# Patient Record
Sex: Female | Born: 1935 | Race: Black or African American | Hispanic: No | Marital: Single | Smoking: Never smoker
Health system: Southern US, Community
[De-identification: ages and names within clinical notes are randomized; demographics above are authoritative.]

## PROBLEM LIST (undated history)

## (undated) DIAGNOSIS — G8929 Other chronic pain: Secondary | ICD-10-CM

## (undated) DIAGNOSIS — M1990 Unspecified osteoarthritis, unspecified site: Secondary | ICD-10-CM

## (undated) DIAGNOSIS — D649 Anemia, unspecified: Secondary | ICD-10-CM

## (undated) DIAGNOSIS — M25569 Pain in unspecified knee: Secondary | ICD-10-CM

## (undated) DIAGNOSIS — I82409 Acute embolism and thrombosis of unspecified deep veins of unspecified lower extremity: Secondary | ICD-10-CM

## (undated) DIAGNOSIS — I10 Essential (primary) hypertension: Secondary | ICD-10-CM

## (undated) DIAGNOSIS — E46 Unspecified protein-calorie malnutrition: Secondary | ICD-10-CM

## (undated) HISTORY — PX: TOTAL KNEE ARTHROPLASTY: SHX125

## (undated) HISTORY — DX: Anemia, unspecified: D64.9

## (undated) HISTORY — DX: Acute embolism and thrombosis of unspecified deep veins of unspecified lower extremity: I82.409

---

## 2005-01-02 ENCOUNTER — Emergency Department (HOSPITAL_COMMUNITY): Admission: EM | Admit: 2005-01-02 | Discharge: 2005-01-02 | Payer: Self-pay | Admitting: Emergency Medicine

## 2010-11-25 DIAGNOSIS — I82409 Acute embolism and thrombosis of unspecified deep veins of unspecified lower extremity: Secondary | ICD-10-CM

## 2010-11-25 DIAGNOSIS — D649 Anemia, unspecified: Secondary | ICD-10-CM

## 2010-11-25 HISTORY — DX: Acute embolism and thrombosis of unspecified deep veins of unspecified lower extremity: I82.409

## 2010-11-25 HISTORY — DX: Anemia, unspecified: D64.9

## 2010-11-25 HISTORY — PX: ORIF DISTAL FEMUR FRACTURE: SUR926

## 2010-11-27 ENCOUNTER — Emergency Department (HOSPITAL_COMMUNITY): Payer: Medicare Other

## 2010-11-27 ENCOUNTER — Inpatient Hospital Stay (HOSPITAL_COMMUNITY)
Admission: EM | Admit: 2010-11-27 | Discharge: 2010-12-05 | DRG: 481 | Disposition: A | Discharge status: Skilled Nursing Facility | Payer: Medicare Other | Attending: Specialist | Admitting: Specialist

## 2010-11-27 ENCOUNTER — Other Ambulatory Visit: Payer: Self-pay

## 2010-11-27 ENCOUNTER — Emergency Department (HOSPITAL_COMMUNITY)
Admission: EM | Admit: 2010-11-27 | Discharge: 2010-11-27 | Disposition: A | Discharge status: Other Acute Inpatient Hospital | Payer: Medicare Other | Source: Home / Self Care | Attending: Emergency Medicine | Admitting: Emergency Medicine

## 2010-11-27 ENCOUNTER — Encounter: Payer: Self-pay | Admitting: Emergency Medicine

## 2010-11-27 DIAGNOSIS — W010XXA Fall on same level from slipping, tripping and stumbling without subsequent striking against object, initial encounter: Secondary | ICD-10-CM | POA: Insufficient documentation

## 2010-11-27 DIAGNOSIS — Z79899 Other long term (current) drug therapy: Secondary | ICD-10-CM

## 2010-11-27 DIAGNOSIS — Y998 Other external cause status: Secondary | ICD-10-CM

## 2010-11-27 DIAGNOSIS — Z96659 Presence of unspecified artificial knee joint: Secondary | ICD-10-CM

## 2010-11-27 DIAGNOSIS — I10 Essential (primary) hypertension: Secondary | ICD-10-CM | POA: Diagnosis present

## 2010-11-27 DIAGNOSIS — S7292XA Unspecified fracture of left femur, initial encounter for closed fracture: Secondary | ICD-10-CM

## 2010-11-27 DIAGNOSIS — S72453A Displaced supracondylar fracture without intracondylar extension of lower end of unspecified femur, initial encounter for closed fracture: Principal | ICD-10-CM | POA: Diagnosis present

## 2010-11-27 DIAGNOSIS — Y92009 Unspecified place in unspecified non-institutional (private) residence as the place of occurrence of the external cause: Secondary | ICD-10-CM

## 2010-11-27 DIAGNOSIS — M25569 Pain in unspecified knee: Secondary | ICD-10-CM | POA: Insufficient documentation

## 2010-11-27 DIAGNOSIS — D62 Acute posthemorrhagic anemia: Secondary | ICD-10-CM | POA: Diagnosis not present

## 2010-11-27 DIAGNOSIS — S7290XA Unspecified fracture of unspecified femur, initial encounter for closed fracture: Secondary | ICD-10-CM | POA: Insufficient documentation

## 2010-11-27 DIAGNOSIS — R7309 Other abnormal glucose: Secondary | ICD-10-CM | POA: Diagnosis present

## 2010-11-27 DIAGNOSIS — Z7982 Long term (current) use of aspirin: Secondary | ICD-10-CM

## 2010-11-27 DIAGNOSIS — Y93H2 Activity, gardening and landscaping: Secondary | ICD-10-CM

## 2010-11-27 HISTORY — DX: Pain in unspecified knee: M25.569

## 2010-11-27 HISTORY — DX: Essential (primary) hypertension: I10

## 2010-11-27 HISTORY — DX: Other chronic pain: G89.29

## 2010-11-27 LAB — APTT: aPTT: 28 seconds (ref 24–37)

## 2010-11-27 LAB — BASIC METABOLIC PANEL
CO2: 24 mEq/L (ref 19–32)
Calcium: 9.2 mg/dL (ref 8.4–10.5)
Chloride: 102 mEq/L (ref 96–112)
GFR calc Af Amer: 37 mL/min — ABNORMAL LOW (ref >90)
GFR calc non Af Amer: 32 mL/min — ABNORMAL LOW (ref >90)
Glucose, Bld: 111 mg/dL — ABNORMAL HIGH (ref 70–99)
Potassium: 4 mEq/L (ref 3.5–5.1)
Sodium: 138 mEq/L (ref 135–145)

## 2010-11-27 LAB — DIFFERENTIAL
Basophils Absolute: 0.1 10*3/uL (ref 0.0–0.1)
Eosinophils Absolute: 0.2 10*3/uL (ref 0.0–0.7)
Eosinophils Relative: 2 % (ref 0–5)
Lymphocytes Relative: 16 % (ref 12–46)
Lymphs Abs: 1.6 10*3/uL (ref 0.7–4.0)
Monocytes Absolute: 0.8 10*3/uL (ref 0.1–1.0)
Neutro Abs: 6.9 10*3/uL (ref 1.7–7.7)
Neutrophils Relative %: 72 % (ref 43–77)

## 2010-11-27 LAB — CBC
HCT: 36.3 % (ref 36.0–46.0)
MCH: 27.8 pg (ref 26.0–34.0)
MCHC: 31.7 g/dL (ref 30.0–36.0)
MCV: 87.9 fL (ref 78.0–100.0)
Platelets: 225 10*3/uL (ref 150–400)
RDW: 14.6 % (ref 11.5–15.5)
WBC: 9.6 10*3/uL (ref 4.0–10.5)

## 2010-11-27 LAB — PROTIME-INR
INR: 1.19 (ref 0.00–1.49)
Prothrombin Time: 15.4 seconds — ABNORMAL HIGH (ref 11.6–15.2)

## 2010-11-27 LAB — TYPE AND SCREEN: Antibody Screen: NEGATIVE

## 2010-11-27 MED ORDER — HYDROMORPHONE HCL 1 MG/ML IJ SOLN
1.0000 mg | INTRAMUSCULAR | Status: DC | PRN
  Administered 2010-11-27: 1 mg via INTRAVENOUS
  Filled 2010-11-27: qty 1

## 2010-11-27 MED ORDER — SODIUM CHLORIDE 0.9 % IV SOLN
INTRAVENOUS | Status: DC
  Administered 2010-11-27: 20:00:00 via INTRAVENOUS

## 2010-11-27 MED ORDER — HYDROMORPHONE HCL 1 MG/ML IJ SOLN
1.0000 mg | Freq: Once | INTRAMUSCULAR | Status: AC
  Administered 2010-11-27: 1 mg via INTRAVENOUS
  Filled 2010-11-27: qty 1

## 2010-11-27 NOTE — ED Provider Notes (Signed)
History     CSN: [id number] Arrival date & time: 11/27/2010  6:16 PM  Chief Complaint  Patient presents with  . Fall  . Knee Pain    (Consider location/radiation/quality/duration/timing/severity/associated sxs/prior treatment) HPI Pt reports she slipped and fell just prior to arrival, landing on her L knee. Complaining of severe aching pain, worse with movement. Unable to bear weight. She has had prior bilateral total knee arthoplasty by Dr. Hilda Lias. Denies any head injury or LOC. No other complaints at this time.  Past Medical History  Diagnosis Date  . Hypertension   . Chronic knee pain     Past Surgical History  Procedure Date  . Knee surgery     History reviewed. No pertinent family history.  History  Substance Use Topics  . Smoking status: Never Smoker   . Smokeless tobacco: Not on file  . Alcohol Use: No    OB History    Grav Para Term Preterm Abortions TAB SAB Ect Mult Living                  Review of Systems All other systems reviewed and are negative except as noted in HPI.    Allergies  Review of patient's allergies indicates no known allergies.  Home Medications   Current Outpatient Rx  Name Route Sig Dispense Refill  . ASPIRIN EC 81 MG PO TBEC Oral Take 81 mg by mouth every other day.      Marland Kitchen PRESCRIPTION MEDICATION Oral Take 1 tablet by mouth daily. UNKNOWN NAME **Blood Pressure**     . TRAMADOL HCL 50 MG PO TABS Oral Take 50 mg by mouth 2 (two) times daily. Prn for pain       BP 136/69  Pulse 88  Temp(Src) 98 F (36.7 C) (Oral)  Resp 18  SpO2 98%  Physical Exam  Nursing note and vitals reviewed. Constitutional: She is oriented to person, place, and time. She appears well-developed and well-nourished.  HENT:  Head: Normocephalic and atraumatic.  Eyes: EOM are normal. Pupils are equal, round, and reactive to light.  Neck: Normal range of motion. Neck supple.  Cardiovascular: Normal rate, normal heart sounds and intact distal pulses.     Pulmonary/Chest: Effort normal and breath sounds normal.  Abdominal: Bowel sounds are normal. She exhibits no distension. There is no tenderness.  Musculoskeletal: Normal range of motion. She exhibits tenderness (tender, swollen and deformity of L knee/distal femur unable to ROM due to pain). She exhibits no edema.       Neuro vascularly intact  Neurological: She is alert and oriented to person, place, and time. She has normal strength. No cranial nerve deficit or sensory deficit.  Skin: Skin is warm and dry. No rash noted.  Psychiatric: She has a normal mood and affect.    ED Course  Procedures (including critical care time)  Labs Reviewed  CBC - Abnormal; Notable for the following:    Hemoglobin 11.5 (*)    All other components within normal limits  BASIC METABOLIC PANEL - Abnormal; Notable for the following:    Glucose, Bld 111 (*)    Creatinine, Ser 1.46 (*)    GFR calc non Af Amer 32 (*)    GFR calc Af Amer 37 (*)    All other components within normal limits  PROTIME-INR - Abnormal; Notable for the following:    Prothrombin Time 15.4 (*)    All other components within normal limits  DIFFERENTIAL  APTT  TYPE AND SCREEN  Dg Chest 1 View  11/27/2010  *RADIOLOGY REPORT*  Clinical Data: Fall.  No chest complaints.  CHEST - 1 VIEW  Comparison: None.  Findings: No obvious fracture or pneumothorax.  Calcified mildly tortuous aorta.  Cardiomegaly.  Prominence of the right hilar region may be related to prominent vessels.  This can be assessed on follow-up two-view exam to help exclude underlying mass.  Minimal atelectatic changes right lung base.  No infiltrate or congestive heart failure.  IMPRESSION: No obvious fracture or pneumothorax.  Calcified mildly tortuous aorta.  Cardiomegaly.  Prominence of the right hilar region may be related to prominent vessels.  This can be assessed on follow-up two-view exam to help exclude underlying mass.  Minimal atelectatic changes right lung base.   No infiltrate or congestive heart failure.  Original Report Authenticated By: Fuller Canada, M.D.   Dg Knee Complete 4 Views Left  11/27/2010  *RADIOLOGY REPORT*  Clinical Data: Fall, left knee pain  LEFT KNEE - COMPLETE 4+ VIEW  Comparison: None.  Findings: Four views of the left knee submitted.  There is oblique displaced fracture of the distal left femur just above the prosthesis.  Left knee prosthesis in anatomic alignment. Diffuse osteopenia is noted.  IMPRESSION: Oblique displaced fracture of distal left femur.  Per CMS PQRS reporting requirements (PQRS Measure 24): Given the patient's age of greater than 50 and the fracture site (hip, distal radius, or spine), the patient should be tested for osteoporosis using DXA, and the appropriate treatment considered based on the DXA results.  Original Report Authenticated By: Natasha Mead, M.D.      MDM  Spoke with Dr. Romeo Apple on call for Ortho who recommended transfer to [facility] due to his inability to repair this fracture. Spoke with Dr. Lestine Box at [facility] who also was not comfortable with the repair and referred me to the Orthopedist at [facility]. Dr. Otelia Sergeant at [facility] has graciously agreed to accept the patient in transfer to the ED for evaluation and repair of her fracture. Spoke with Dr. Juleen China in the ED at [facility] to inform him of the patients pending arrival. [facility] to transfer. Pt amenable to this plan. Knee immobilizer ordered.         Charles B. Bernette Mayers, MD 11/27/10 2152

## 2010-11-27 NOTE — ED Notes (Signed)
Beeped on call ortho at [facility] through [facility].

## 2010-11-27 NOTE — ED Notes (Signed)
Pt hanging plants on porch and fell on L knee. Pt has hx of bilateral knee surgeries and chronic pain. Did not hit head or LOC. Pt is alert/roiented. nad at this time.

## 2010-11-27 NOTE — ED Notes (Signed)
Report called to [facility] ED.  Pt transported via [facility].

## 2010-11-28 ENCOUNTER — Inpatient Hospital Stay (HOSPITAL_COMMUNITY): Payer: Medicare Other

## 2010-11-28 LAB — BASIC METABOLIC PANEL
BUN: 22 mg/dL (ref 6–23)
CO2: 24 mEq/L (ref 19–32)
Calcium: 8.9 mg/dL (ref 8.4–10.5)
Chloride: 106 mEq/L (ref 96–112)
Creatinine, Ser: 1.35 mg/dL — ABNORMAL HIGH (ref 0.50–1.10)
GFR calc Af Amer: 41 mL/min — ABNORMAL LOW (ref >90)
GFR calc non Af Amer: 35 mL/min — ABNORMAL LOW (ref >90)
Glucose, Bld: 136 mg/dL — ABNORMAL HIGH (ref 70–99)
Potassium: 4.1 mEq/L (ref 3.5–5.1)
Sodium: 140 mEq/L (ref 135–145)

## 2010-11-28 LAB — CBC
HCT: 30 % — ABNORMAL LOW (ref 36.0–46.0)
Hemoglobin: 9.5 g/dL — ABNORMAL LOW (ref 12.0–15.0)
MCH: 27.9 pg (ref 26.0–34.0)
MCHC: 31.7 g/dL (ref 30.0–36.0)
RDW: 14.7 % (ref 11.5–15.5)
WBC: 8.5 10*3/uL (ref 4.0–10.5)

## 2010-11-28 LAB — ABO/RH: ABO/RH(D): O POS

## 2010-11-29 LAB — BASIC METABOLIC PANEL
BUN: 19 mg/dL (ref 6–23)
CO2: 23 mEq/L (ref 19–32)
Chloride: 108 mEq/L (ref 96–112)
GFR calc non Af Amer: 40 mL/min — ABNORMAL LOW (ref >90)
Glucose, Bld: 106 mg/dL — ABNORMAL HIGH (ref 70–99)
Potassium: 4.2 mEq/L (ref 3.5–5.1)

## 2010-11-29 LAB — CBC
Hemoglobin: 8.4 g/dL — ABNORMAL LOW (ref 12.0–15.0)
MCHC: 32.2 g/dL (ref 30.0–36.0)
MCV: 86.7 fL (ref 78.0–100.0)
Platelets: 166 10*3/uL (ref 150–400)
RBC: 3.01 MIL/uL — ABNORMAL LOW (ref 3.87–5.11)
RDW: 15 % (ref 11.5–15.5)
WBC: 11.4 10*3/uL — ABNORMAL HIGH (ref 4.0–10.5)

## 2010-11-29 LAB — PROTIME-INR
INR: 1.36 (ref 0.00–1.49)
Prothrombin Time: 17 seconds — ABNORMAL HIGH (ref 11.6–15.2)

## 2010-11-30 ENCOUNTER — Inpatient Hospital Stay (HOSPITAL_COMMUNITY): Payer: Medicare Other

## 2010-11-30 LAB — CBC
HCT: 22.3 % — ABNORMAL LOW (ref 36.0–46.0)
Hemoglobin: 7.2 g/dL — ABNORMAL LOW (ref 12.0–15.0)
MCV: 88.1 fL (ref 78.0–100.0)
RBC: 2.53 MIL/uL — ABNORMAL LOW (ref 3.87–5.11)
WBC: 9.5 10*3/uL (ref 4.0–10.5)

## 2010-11-30 LAB — BASIC METABOLIC PANEL
BUN: 14 mg/dL (ref 6–23)
CO2: 26 mEq/L (ref 19–32)
Chloride: 109 mEq/L (ref 96–112)
Creatinine, Ser: 1.04 mg/dL (ref 0.50–1.10)
Potassium: 3.9 mEq/L (ref 3.5–5.1)

## 2010-11-30 LAB — PROTIME-INR: INR: 1.51 — ABNORMAL HIGH (ref 0.00–1.49)

## 2010-12-01 LAB — CBC
Hemoglobin: 8.1 g/dL — ABNORMAL LOW (ref 12.0–15.0)
MCH: 28.2 pg (ref 26.0–34.0)
MCV: 85.7 fL (ref 78.0–100.0)
Platelets: 187 10*3/uL (ref 150–400)
RBC: 2.87 MIL/uL — ABNORMAL LOW (ref 3.87–5.11)
WBC: 10.3 10*3/uL (ref 4.0–10.5)

## 2010-12-01 LAB — PROTIME-INR: Prothrombin Time: 17.1 seconds — ABNORMAL HIGH (ref 11.6–15.2)

## 2010-12-02 LAB — PROTIME-INR
INR: 1.38 (ref 0.00–1.49)
Prothrombin Time: 17.2 seconds — ABNORMAL HIGH (ref 11.6–15.2)

## 2010-12-03 LAB — TYPE AND SCREEN
ABO/RH(D): O POS
Unit division: 0
Unit division: 0

## 2010-12-03 LAB — PROTIME-INR
INR: 1.31 (ref 0.00–1.49)
Prothrombin Time: 16.5 seconds — ABNORMAL HIGH (ref 11.6–15.2)

## 2010-12-03 LAB — HEMOGLOBIN AND HEMATOCRIT, BLOOD: Hemoglobin: 7.5 g/dL — ABNORMAL LOW (ref 12.0–15.0)

## 2010-12-04 LAB — HEMOGLOBIN AND HEMATOCRIT, BLOOD
HCT: 23.3 % — ABNORMAL LOW (ref 36.0–46.0)
Hemoglobin: 7.4 g/dL — ABNORMAL LOW (ref 12.0–15.0)

## 2010-12-05 ENCOUNTER — Inpatient Hospital Stay
Admission: RE | Admit: 2010-12-05 | Discharge: 2011-02-08 | Disposition: A | Discharge status: Home / Self Care | Payer: Medicaid Other | Source: Ambulatory Visit | Attending: Internal Medicine | Admitting: Internal Medicine

## 2010-12-05 DIAGNOSIS — R609 Edema, unspecified: Principal | ICD-10-CM

## 2010-12-05 DIAGNOSIS — R52 Pain, unspecified: Secondary | ICD-10-CM

## 2010-12-05 DIAGNOSIS — Z139 Encounter for screening, unspecified: Secondary | ICD-10-CM

## 2010-12-05 LAB — CROSSMATCH: Antibody Screen: NEGATIVE

## 2010-12-05 LAB — HEMOGLOBIN AND HEMATOCRIT, BLOOD: Hemoglobin: 10.6 g/dL — ABNORMAL LOW (ref 12.0–15.0)

## 2010-12-06 NOTE — H&P (Unsigned)
NAME:  JALEY, YAN NO.:  [id number]  MEDICAL RECORD NO.:  [id number]  LOCATION:  S145                          FACILITY:  [facility]  PHYSICIAN:  Savonna Birchmeier D. Felecia Shelling, MD   DATE OF BIRTH:  Sep 17, 1925  DATE OF ADMISSION:  12/05/2010 DATE OF DISCHARGE:  LH                             HISTORY & PHYSICAL   REASON FOR ADMISSION:  To nursing home for physical therapy following a fracture of the left distal supracondylar femur.  HISTORY OF PRESENT ILLNESS:  This is an 77 year old female patient with history of hypertension, anemia and status post bilateral knee replacement who had an accidental fall on her porch and she was transferred to [facility].  The patient underwent a closely reduction and internal fixation over the left periprosthetic supracondylar femur fracture.  Postoperatively, the patient became anemic.  Her CBC was monitored and she was transfused 2 units of packed red blood cells.  The patient was transferred here for physical therapy and skilled nursing care.  PAST MEDICAL HISTORY: 1. Hypertension. 2. Anemia. 3. Status post bilateral knee replacement. 4. Closed left distal supracondylar femur fracture. 5. Status post closed reduction and internal fixation of the left     periprosthetic supracondylar fracture.  CURRENT MEDICATIONS: 1. Colace 100 mg b.i.d. 2. Ferrous sulfate 325 mg b.i.d. 3. Robaxin 1 tablet p.o. q.6 hours. 4. Percocet 5/325 one tablet p.o. q.6 hours p.r.n. 5. MiraLax 17 g daily. 6. Ultram 50 mg q.6 hours. 7. Azor 5/40 one tablet daily. 8. The patient would be on Coumadin and would be anticoagulated until     the patient is able to ambulate.  SOCIAL HISTORY:  The patient lives with her son.  No history of alcohol, tobacco, or substance abuse.  PHYSICAL EXAMINATION:  GENERAL:  The patient is alert, awake and chronically sick looking. VITALS:  Blood pressure 130/80, pulse 88, respiratory rate 16, temperature 98 degrees  Fahrenheit. HEENT:  Pupils are equal, reactive. NECK:  Supple. CHEST:  Clear lung fields.  Good air entry. CARDIOVASCULAR SYSTEM:  First and second heart sound heard.  No murmur. No gallop. ABDOMEN:  Soft and lax.  Bowel sound is positive.  No mass or organomegaly. EXTREMITIES:  The patient has splinted and immobilized in her left knee. There is no leg swelling.  ASSESSMENT: 1. Left distal femoral fracture. 2. Status post closed distal supracondylar femur fracture reduction     and internal fixation. 3. Hypertension. 4. Postoperative anemia. 5. History of bilateral knee replacement.  PLAN:  We will continue the patient on physical therapy and occupational therapy.  Continue anticoagulation.  We will continue to monitor her PT/INR.  Continue assisting daily living activity.  Continue current medications including pain management.  We will plan to discharge the patient home as soon as she is able to ambulate and participate in her daily living activity.     Lilyanah Celestin D. Felecia Shelling, MD     TDF/MEDQ  D:  12/06/2010  T:  12/06/2010  Job:  [id number]

## 2010-12-26 DIAGNOSIS — Z7901 Long term (current) use of anticoagulants: Secondary | ICD-10-CM | POA: Insufficient documentation

## 2011-01-03 ENCOUNTER — Ambulatory Visit (HOSPITAL_COMMUNITY)
Admit: 2011-01-03 | Discharge: 2011-01-03 | Disposition: A | Discharge status: Skilled Nursing Facility | Payer: Medicare Other | Source: Skilled Nursing Facility | Attending: Internal Medicine | Admitting: Internal Medicine

## 2011-01-03 DIAGNOSIS — M7989 Other specified soft tissue disorders: Secondary | ICD-10-CM | POA: Insufficient documentation

## 2011-01-03 DIAGNOSIS — I824Y9 Acute embolism and thrombosis of unspecified deep veins of unspecified proximal lower extremity: Secondary | ICD-10-CM | POA: Insufficient documentation

## 2011-01-03 DIAGNOSIS — M79609 Pain in unspecified limb: Secondary | ICD-10-CM | POA: Insufficient documentation

## 2011-01-07 ENCOUNTER — Other Ambulatory Visit (HOSPITAL_COMMUNITY): Payer: Medicare Other

## 2011-01-21 ENCOUNTER — Inpatient Hospital Stay (HOSPITAL_COMMUNITY)
Admit: 2011-01-21 | Discharge: 2011-01-21 | Disposition: A | Discharge status: Home / Self Care | Payer: Medicare Other | Attending: Internal Medicine | Admitting: Internal Medicine

## 2011-03-20 ENCOUNTER — Ambulatory Visit (INDEPENDENT_AMBULATORY_CARE_PROVIDER_SITE_OTHER): Payer: Medicare Other | Admitting: *Deleted

## 2011-03-20 ENCOUNTER — Encounter: Payer: Self-pay | Admitting: Cardiology

## 2011-03-20 ENCOUNTER — Ambulatory Visit (INDEPENDENT_AMBULATORY_CARE_PROVIDER_SITE_OTHER): Payer: Medicare Other | Admitting: Cardiology

## 2011-03-20 VITALS — BP 143/77 | HR 103 | Resp 16 | Ht 65.0 in | Wt 175.0 lb

## 2011-03-20 DIAGNOSIS — S72453A Displaced supracondylar fracture without intracondylar extension of lower end of unspecified femur, initial encounter for closed fracture: Secondary | ICD-10-CM | POA: Insufficient documentation

## 2011-03-20 DIAGNOSIS — Z7901 Long term (current) use of anticoagulants: Secondary | ICD-10-CM

## 2011-03-20 DIAGNOSIS — D649 Anemia, unspecified: Secondary | ICD-10-CM | POA: Insufficient documentation

## 2011-03-20 DIAGNOSIS — I749 Embolism and thrombosis of unspecified artery: Secondary | ICD-10-CM

## 2011-03-20 DIAGNOSIS — I82409 Acute embolism and thrombosis of unspecified deep veins of unspecified lower extremity: Secondary | ICD-10-CM | POA: Insufficient documentation

## 2011-03-20 LAB — POCT INR: INR: 1.8

## 2011-03-20 NOTE — Assessment & Plan Note (Signed)
Patient already has compression stockings and is advised to wear them although no post phlebitic abnormalities are noted at present.

## 2011-03-20 NOTE — Patient Instructions (Signed)
Your physician recommends that you schedule a follow-up appointment in: 3 months  Your physician recommends that you return for lab work in: Within the week  Wear Compression Stockings  Stools x 3 for blood and return to office asap

## 2011-03-20 NOTE — Assessment & Plan Note (Signed)
Patient is tolerating anticoagulation well without apparent bleeding or other complications.  Total course of 6 months appears reasonable for deep vein thrombosis in the setting of a fracture of the femur, operative intervention and limited postoperative mobility.

## 2011-03-20 NOTE — Assessment & Plan Note (Signed)
CBC will be reassessed and occult GI blood loss excluded with stool Hemoccult testing.

## 2011-03-20 NOTE — Progress Notes (Signed)
Patient ID: Leslie Pope, female   DOB: 02-Jun-1925, 78 y.o.   MRN: [id number] HPI: Initial office visit arranged at the kind request of Dr. Felecia Shelling for continuing management of anticoagulation.  This nice woman suffered a fall in October resulting in a femoral fracture treated with closed reduction and application of a knee brace.  She was diagnosed with deep vein thrombosis in the left leg the following month and has been maintained on warfarin since without swelling or significant discomfort.  She was recently discharged from a local skilled nursing facility and now requires anticoagulation management.  Current Outpatient Prescriptions on File Prior to Visit  Medication Sig Dispense Refill  . traMADol (ULTRAM) 50 MG tablet Take 50 mg by mouth 2 (two) times daily. Prn for pain        No Known Allergies    Past Medical History  Diagnosis Date  . Hypertension   . Chronic knee pain      Past Surgical History  Procedure Date  . Knee surgery      History reviewed. No pertinent family history.   History   Social History  . Marital Status: Single    Spouse Name: N/A    Number of Children: N/A  . Years of Education: N/A   Occupational History  . Not on file.   Social History Main Topics  . Smoking status: Never Smoker   . Smokeless tobacco: Not on file  . Alcohol Use: No  . Drug Use: No  . Sexually Active:    Other Topics Concern  . Not on file   Social History Narrative  . No narrative on file  ROS:      All other systems reviewed and are negative.  PHYSICAL EXAM: BP 143/77  Pulse 103  Resp 16  Ht 5\' 5"  (1.651 m)  Wt 79.379 kg (175 lb)  BMI 29.12 kg/m2; 11 pound weight loss in 6 weeks  General-Well-developed; no acute distress Body Habitus-mildly overweight HEENT-[place]/AT; PERRL; EOM intact; conjunctiva and lids nl; bilateral arcus Neck-No JVD; no carotid bruits Endocrine-No thyromegaly Lungs-Clear lung fields; resonant percussion; normal I-to-E ratio Cardiovascular-  normal PMI; normal S1 and S2 Abdomen-BS normal; soft and non-tender without masses or organomegaly Musculoskeletal-No deformities, cyanosis or clubbing Neurologic-Nl cranial nerves; symmetric strength and tone Skin- Warm, no significant lesions Extremities-Nl distal pulses; trace edema   EKG:  Tracing obtained 11/27/10 was reviewed.  Normal sinus rhythm, borderline left atrial abnormality, delayed R-wave progression-likely related to pre-excitation(WPW type B).  No previous tracing for comparison.  ASSESSMENT AND PLAN:  Sutton Bing, MD 03/20/2011 1:43 PM

## 2011-03-26 ENCOUNTER — Encounter: Payer: Self-pay | Admitting: *Deleted

## 2011-03-26 LAB — COMPREHENSIVE METABOLIC PANEL
Albumin: 3.9 g/dL (ref 3.5–5.2)
BUN: 13 mg/dL (ref 6–23)
CO2: 22 mEq/L (ref 19–32)
Calcium: 9.1 mg/dL (ref 8.4–10.5)
Chloride: 104 mEq/L (ref 96–112)
Creat: 1.16 mg/dL — ABNORMAL HIGH (ref 0.50–1.10)
Glucose, Bld: 94 mg/dL (ref 70–99)
Potassium: 4.5 mEq/L (ref 3.5–5.3)

## 2011-03-26 LAB — CBC
MCHC: 31.9 g/dL (ref 30.0–36.0)
Platelets: 264 10*3/uL (ref 150–400)
RDW: 14 % (ref 11.5–15.5)

## 2011-04-01 ENCOUNTER — Other Ambulatory Visit: Payer: Self-pay

## 2011-04-01 ENCOUNTER — Encounter (INDEPENDENT_AMBULATORY_CARE_PROVIDER_SITE_OTHER): Payer: Medicare Other | Admitting: *Deleted

## 2011-04-01 DIAGNOSIS — Z7901 Long term (current) use of anticoagulants: Secondary | ICD-10-CM

## 2011-04-03 ENCOUNTER — Ambulatory Visit (INDEPENDENT_AMBULATORY_CARE_PROVIDER_SITE_OTHER): Payer: Medicare Other | Admitting: *Deleted

## 2011-04-03 DIAGNOSIS — Z7901 Long term (current) use of anticoagulants: Secondary | ICD-10-CM

## 2011-04-03 DIAGNOSIS — I749 Embolism and thrombosis of unspecified artery: Secondary | ICD-10-CM

## 2011-04-03 LAB — POCT INR: INR: 2.1

## 2011-04-24 ENCOUNTER — Ambulatory Visit (INDEPENDENT_AMBULATORY_CARE_PROVIDER_SITE_OTHER): Payer: 59 | Admitting: *Deleted

## 2011-04-24 DIAGNOSIS — I749 Embolism and thrombosis of unspecified artery: Secondary | ICD-10-CM

## 2011-04-24 DIAGNOSIS — Z7901 Long term (current) use of anticoagulants: Secondary | ICD-10-CM

## 2011-05-22 ENCOUNTER — Ambulatory Visit (INDEPENDENT_AMBULATORY_CARE_PROVIDER_SITE_OTHER): Payer: 59 | Admitting: *Deleted

## 2011-05-22 DIAGNOSIS — I749 Embolism and thrombosis of unspecified artery: Secondary | ICD-10-CM

## 2011-05-22 DIAGNOSIS — Z7901 Long term (current) use of anticoagulants: Secondary | ICD-10-CM

## 2011-06-12 ENCOUNTER — Ambulatory Visit (INDEPENDENT_AMBULATORY_CARE_PROVIDER_SITE_OTHER): Payer: 59 | Admitting: *Deleted

## 2011-06-12 DIAGNOSIS — I749 Embolism and thrombosis of unspecified artery: Secondary | ICD-10-CM

## 2011-06-12 DIAGNOSIS — Z7901 Long term (current) use of anticoagulants: Secondary | ICD-10-CM

## 2011-06-17 ENCOUNTER — Telehealth: Payer: Self-pay | Admitting: Cardiology

## 2011-06-17 MED ORDER — WARFARIN SODIUM 5 MG PO TABS: 5.0000 mg | ORAL_TABLET | ORAL | Status: DC

## 2011-06-17 NOTE — Telephone Encounter (Signed)
rx sent into pharmacy, pt notified. 

## 2011-06-17 NOTE — Telephone Encounter (Signed)
Pt needs warfarin called in to [facility]

## 2011-06-18 ENCOUNTER — Ambulatory Visit: Payer: Medicare Other | Admitting: Cardiology

## 2011-06-19 ENCOUNTER — Ambulatory Visit (INDEPENDENT_AMBULATORY_CARE_PROVIDER_SITE_OTHER): Payer: 59 | Admitting: Adult Health

## 2011-06-19 ENCOUNTER — Encounter: Payer: Self-pay | Admitting: Adult Health

## 2011-06-19 VITALS — BP 165/71 | HR 82 | Resp 16 | Ht 66.0 in | Wt 178.0 lb

## 2011-06-19 DIAGNOSIS — Z7901 Long term (current) use of anticoagulants: Secondary | ICD-10-CM

## 2011-06-19 DIAGNOSIS — I82409 Acute embolism and thrombosis of unspecified deep veins of unspecified lower extremity: Secondary | ICD-10-CM

## 2011-06-19 NOTE — Assessment & Plan Note (Signed)
She is stable from a cardiac standpoint. No bleeding issues or tarry stools. She is followed in our coumadin clinic for dosing. She is frustrated that she cannot get around as she used to and be as active. She is trying to get used to the walker and need to depend on others for help. She is modifying her activities to accomodate for her limitations.

## 2011-06-19 NOTE — Progress Notes (Signed)
   HPI: Leslie Pope is a pleasant 77 y/o patient of Dr. Dietrich Pates we are seeing on follow-up for hypertension, DVT s/p femoral fracture with closed reduction,with chronic knee pain, on coumadin therapy. She has been doing well but continues to have some pain behind her knee when she is in bed at night. She has been a little depressed because she is not able to do as much as she used due to the left leg injury and chronic arthritis.Seh enjoys working in her garden and now has to depend on a walker for stability. She has no complaints of bleeding, chest pain, dizziness or DOE.   No Known Allergies  Current Outpatient Prescriptions  Medication Sig Dispense Refill  . alendronate (FOSAMAX) 70 MG tablet Take 70 mg by mouth every 7 (seven) days. Take with a full glass of water on an empty stomach.      Marland Kitchen amLODipine-olmesartan (AZOR) 5-40 MG per tablet Take 1 tablet by mouth daily.      . baclofen (LIORESAL) 10 MG tablet Take 10 mg by mouth 2 (two) times daily.      . traMADol (ULTRAM) 50 MG tablet Take 50 mg by mouth 2 (two) times daily. Prn for pain       . triamcinolone (KENALOG) 0.025 % cream Apply topically 2 (two) times daily.      Marland Kitchen warfarin (COUMADIN) 5 MG tablet Take 1 tablet (5 mg total) by mouth as directed.  45 tablet  3    Past Medical History  Diagnosis Date  . Hypertension     Lab: Normal BMet in 11/2010  . Chronic knee pain     status post bilateral TKA  . Deep vein thrombosis 11/2010    following left femoral fracture with closed reduction and immobilization in a knee brace  . Anemia 11/2010    post fracture; transfused 2 units; H&H of 10.6/32.5 on 12/05/10    Past Surgical History  Procedure Date  . Knee surgery     RUE:AVWUJW of systems complete and found to be negative unless listed above  PHYSICAL EXAM BP 165/71  Pulse 82  Resp 16  Ht 5\' 6"  (1.676 m)  Wt 178 lb (80.74 kg)  BMI 28.73 kg/m2  General: Well developed, well nourished, in no acute distress Head: Eyes  PERRLA, No xanthomas.   Normal cephalic and atraumatic Lungs: Clear bilaterally to auscultation and percussion. Heart: HRRR S1 S2, without MRG.  Pulses are 2+ & equal.            No carotid bruit. No JVD.  No abdominal bruits. No femoral bruits. Abdomen: Bowel sounds are positive, abdomen soft and non-tender without masses or                  Hernia's noted. Msk:  Back normal,slow gait, using walker for ambulaltion. Normal strength and tone for age. Extremities: No clubbing, cyanosis or edema. Left knee suture scar, with mild non-pitting edema. DP +1 Neuro: Alert and oriented X 3. Psych:  Good affect, responds appropriately    ASSESSMENT AND PLAN

## 2011-06-19 NOTE — Patient Instructions (Signed)
Your physician recommends that you schedule a follow-up appointment in: 6 months  Stool cards and return to office as soon as possible

## 2011-06-20 ENCOUNTER — Encounter (INDEPENDENT_AMBULATORY_CARE_PROVIDER_SITE_OTHER): Payer: 59

## 2011-06-20 ENCOUNTER — Ambulatory Visit: Payer: Self-pay | Admitting: Adult Health

## 2011-06-20 DIAGNOSIS — Z7901 Long term (current) use of anticoagulants: Secondary | ICD-10-CM

## 2011-06-23 ENCOUNTER — Other Ambulatory Visit: Payer: Self-pay

## 2011-06-23 DIAGNOSIS — Z7901 Long term (current) use of anticoagulants: Secondary | ICD-10-CM

## 2011-06-23 NOTE — Progress Notes (Signed)
**Note De-Identified Leslie Pope Obfuscation** Addended by: Demetrios Loll on: 06/23/2011 09:43 AM   Modules accepted: Orders

## 2011-07-03 ENCOUNTER — Ambulatory Visit (INDEPENDENT_AMBULATORY_CARE_PROVIDER_SITE_OTHER): Payer: 59 | Admitting: *Deleted

## 2011-07-03 DIAGNOSIS — I749 Embolism and thrombosis of unspecified artery: Secondary | ICD-10-CM

## 2011-07-03 DIAGNOSIS — Z7901 Long term (current) use of anticoagulants: Secondary | ICD-10-CM

## 2011-07-31 ENCOUNTER — Ambulatory Visit (INDEPENDENT_AMBULATORY_CARE_PROVIDER_SITE_OTHER): Payer: 59 | Admitting: *Deleted

## 2011-07-31 DIAGNOSIS — I749 Embolism and thrombosis of unspecified artery: Secondary | ICD-10-CM

## 2011-07-31 DIAGNOSIS — Z7901 Long term (current) use of anticoagulants: Secondary | ICD-10-CM

## 2011-07-31 DIAGNOSIS — I82409 Acute embolism and thrombosis of unspecified deep veins of unspecified lower extremity: Secondary | ICD-10-CM

## 2011-09-01 ENCOUNTER — Ambulatory Visit (INDEPENDENT_AMBULATORY_CARE_PROVIDER_SITE_OTHER): Payer: 59 | Admitting: *Deleted

## 2011-09-01 DIAGNOSIS — Z7901 Long term (current) use of anticoagulants: Secondary | ICD-10-CM

## 2011-09-01 DIAGNOSIS — I82409 Acute embolism and thrombosis of unspecified deep veins of unspecified lower extremity: Secondary | ICD-10-CM

## 2011-09-01 DIAGNOSIS — I749 Embolism and thrombosis of unspecified artery: Secondary | ICD-10-CM

## 2011-09-01 LAB — POCT INR: INR: 3.3

## 2011-09-22 ENCOUNTER — Ambulatory Visit (INDEPENDENT_AMBULATORY_CARE_PROVIDER_SITE_OTHER): Payer: 59 | Admitting: *Deleted

## 2011-09-22 DIAGNOSIS — Z7901 Long term (current) use of anticoagulants: Secondary | ICD-10-CM

## 2011-09-22 DIAGNOSIS — I82409 Acute embolism and thrombosis of unspecified deep veins of unspecified lower extremity: Secondary | ICD-10-CM

## 2011-09-22 DIAGNOSIS — I749 Embolism and thrombosis of unspecified artery: Secondary | ICD-10-CM

## 2011-09-22 LAB — POCT INR: INR: 4

## 2011-10-06 ENCOUNTER — Ambulatory Visit (INDEPENDENT_AMBULATORY_CARE_PROVIDER_SITE_OTHER): Payer: 59 | Admitting: *Deleted

## 2011-10-06 DIAGNOSIS — Z7901 Long term (current) use of anticoagulants: Secondary | ICD-10-CM

## 2011-10-06 DIAGNOSIS — I82409 Acute embolism and thrombosis of unspecified deep veins of unspecified lower extremity: Secondary | ICD-10-CM

## 2011-10-06 DIAGNOSIS — I749 Embolism and thrombosis of unspecified artery: Secondary | ICD-10-CM

## 2011-10-20 ENCOUNTER — Ambulatory Visit (INDEPENDENT_AMBULATORY_CARE_PROVIDER_SITE_OTHER): Payer: 59 | Admitting: *Deleted

## 2011-10-20 DIAGNOSIS — Z7901 Long term (current) use of anticoagulants: Secondary | ICD-10-CM

## 2011-10-20 DIAGNOSIS — I749 Embolism and thrombosis of unspecified artery: Secondary | ICD-10-CM

## 2011-10-20 DIAGNOSIS — I82409 Acute embolism and thrombosis of unspecified deep veins of unspecified lower extremity: Secondary | ICD-10-CM

## 2011-11-06 ENCOUNTER — Telehealth: Payer: Self-pay | Admitting: Cardiology

## 2011-11-06 ENCOUNTER — Ambulatory Visit: Payer: Self-pay | Admitting: *Deleted

## 2011-11-06 DIAGNOSIS — Z7901 Long term (current) use of anticoagulants: Secondary | ICD-10-CM

## 2011-11-06 DIAGNOSIS — I82409 Acute embolism and thrombosis of unspecified deep veins of unspecified lower extremity: Secondary | ICD-10-CM

## 2011-11-06 NOTE — Telephone Encounter (Signed)
FYI... Pt was on coumadin for DVT

## 2011-11-06 NOTE — Telephone Encounter (Signed)
Pt D/c from coumadin management.

## 2011-11-06 NOTE — Telephone Encounter (Signed)
PT CANCELED NEXT COUMADIN APPT. STATES THAT DR Felecia Shelling TOOK HER OFF COUMADIN AND PUT HER ON A BABY ASPRIN EVERYDAY.

## 2011-11-07 ENCOUNTER — Encounter: Payer: Self-pay | Admitting: Cardiology

## 2012-11-17 ENCOUNTER — Ambulatory Visit (HOSPITAL_COMMUNITY)
Admission: RE | Admit: 2012-11-17 | Discharge: 2012-11-17 | Disposition: A | Discharge status: Home / Self Care | Payer: Medicare Other | Source: Ambulatory Visit | Attending: Internal Medicine | Admitting: Internal Medicine

## 2013-03-31 ENCOUNTER — Ambulatory Visit (HOSPITAL_COMMUNITY)
Admission: RE | Admit: 2013-03-31 | Discharge: 2013-03-31 | Disposition: A | Discharge status: Home / Self Care | Payer: Medicare Other | Source: Ambulatory Visit | Attending: Internal Medicine | Admitting: Internal Medicine

## 2013-03-31 ENCOUNTER — Other Ambulatory Visit (HOSPITAL_COMMUNITY): Payer: Self-pay | Admitting: Internal Medicine

## 2013-03-31 DIAGNOSIS — Z96659 Presence of unspecified artificial knee joint: Secondary | ICD-10-CM | POA: Insufficient documentation

## 2013-03-31 DIAGNOSIS — M25559 Pain in unspecified hip: Secondary | ICD-10-CM | POA: Insufficient documentation

## 2013-03-31 DIAGNOSIS — M25552 Pain in left hip: Secondary | ICD-10-CM

## 2013-03-31 DIAGNOSIS — M810 Age-related osteoporosis without current pathological fracture: Secondary | ICD-10-CM | POA: Insufficient documentation

## 2014-03-15 DIAGNOSIS — I10 Essential (primary) hypertension: Secondary | ICD-10-CM | POA: Diagnosis not present

## 2014-03-15 DIAGNOSIS — M1990 Unspecified osteoarthritis, unspecified site: Secondary | ICD-10-CM | POA: Diagnosis not present

## 2014-03-16 ENCOUNTER — Inpatient Hospital Stay (HOSPITAL_COMMUNITY)
Admission: EM | Admit: 2014-03-16 | Discharge: 2014-03-20 | DRG: 690 | Disposition: A | Discharge status: Home / Self Care | Payer: Medicare Other | Attending: Internal Medicine | Admitting: Internal Medicine

## 2014-03-16 ENCOUNTER — Encounter (HOSPITAL_COMMUNITY): Payer: Self-pay | Admitting: *Deleted

## 2014-03-16 DIAGNOSIS — E876 Hypokalemia: Secondary | ICD-10-CM | POA: Diagnosis not present

## 2014-03-16 DIAGNOSIS — E860 Dehydration: Secondary | ICD-10-CM | POA: Diagnosis not present

## 2014-03-16 DIAGNOSIS — E869 Volume depletion, unspecified: Secondary | ICD-10-CM | POA: Diagnosis not present

## 2014-03-16 DIAGNOSIS — Z8249 Family history of ischemic heart disease and other diseases of the circulatory system: Secondary | ICD-10-CM

## 2014-03-16 DIAGNOSIS — Z833 Family history of diabetes mellitus: Secondary | ICD-10-CM

## 2014-03-16 DIAGNOSIS — R000 Tachycardia, unspecified: Secondary | ICD-10-CM | POA: Diagnosis not present

## 2014-03-16 DIAGNOSIS — R197 Diarrhea, unspecified: Secondary | ICD-10-CM | POA: Diagnosis not present

## 2014-03-16 DIAGNOSIS — I10 Essential (primary) hypertension: Secondary | ICD-10-CM | POA: Diagnosis present

## 2014-03-16 DIAGNOSIS — R739 Hyperglycemia, unspecified: Secondary | ICD-10-CM | POA: Diagnosis not present

## 2014-03-16 DIAGNOSIS — N390 Urinary tract infection, site not specified: Secondary | ICD-10-CM | POA: Diagnosis not present

## 2014-03-16 DIAGNOSIS — N289 Disorder of kidney and ureter, unspecified: Secondary | ICD-10-CM

## 2014-03-16 DIAGNOSIS — R404 Transient alteration of awareness: Secondary | ICD-10-CM | POA: Diagnosis not present

## 2014-03-16 DIAGNOSIS — R531 Weakness: Secondary | ICD-10-CM | POA: Diagnosis not present

## 2014-03-16 LAB — URINALYSIS, ROUTINE W REFLEX MICROSCOPIC
Glucose, UA: NEGATIVE mg/dL
Nitrite: POSITIVE — AB
Protein, ur: 30 mg/dL — AB
Specific Gravity, Urine: >1.03 — ABNORMAL HIGH (ref 1.005–1.030)
UROBILINOGEN UA: 0.2 mg/dL (ref 0.0–1.0)
pH: 6 (ref 5.0–8.0)

## 2014-03-16 LAB — URINE MICROSCOPIC-ADD ON

## 2014-03-16 LAB — CBC WITH DIFFERENTIAL/PLATELET
Basophils Absolute: 0 10*3/uL (ref 0.0–0.1)
Basophils Relative: 0 % (ref 0–1)
EOS ABS: 0 10*3/uL (ref 0.0–0.7)
Eosinophils Relative: 0 % (ref 0–5)
HCT: 36.8 % (ref 36.0–46.0)
Hemoglobin: 11.9 g/dL — ABNORMAL LOW (ref 12.0–15.0)
LYMPHS PCT: 16 % (ref 12–46)
Lymphs Abs: 1.6 10*3/uL (ref 0.7–4.0)
MCH: 28.1 pg (ref 26.0–34.0)
MCHC: 32.3 g/dL (ref 30.0–36.0)
MCV: 87 fL (ref 78.0–100.0)
MONOS PCT: 9 % (ref 3–12)
Monocytes Absolute: 0.9 10*3/uL (ref 0.1–1.0)
NEUTROS ABS: 7.9 10*3/uL — AB (ref 1.7–7.7)
NEUTROS PCT: 75 % (ref 43–77)
PLATELETS: 298 10*3/uL (ref 150–400)
RBC: 4.23 MIL/uL (ref 3.87–5.11)
RDW: 15.6 % — ABNORMAL HIGH (ref 11.5–15.5)
WBC: 10.5 10*3/uL (ref 4.0–10.5)

## 2014-03-16 LAB — BASIC METABOLIC PANEL
Anion gap: 17 — ABNORMAL HIGH (ref 5–15)
BUN: 27 mg/dL — ABNORMAL HIGH (ref 6–23)
CALCIUM: 8.6 mg/dL (ref 8.4–10.5)
CO2: 22 mmol/L (ref 19–32)
CREATININE: 1.91 mg/dL — AB (ref 0.50–1.10)
Chloride: 105 mEq/L (ref 96–112)
GFR calc Af Amer: 26 mL/min — ABNORMAL LOW (ref >90)
GFR, EST NON AFRICAN AMERICAN: 23 mL/min — AB (ref >90)
GLUCOSE: 105 mg/dL — AB (ref 70–99)
Potassium: 2.1 mmol/L — CL (ref 3.5–5.1)
Sodium: 144 mmol/L (ref 135–145)

## 2014-03-16 LAB — PROTIME-INR
INR: 1.28 (ref 0.00–1.49)
PROTHROMBIN TIME: 16.1 s — AB (ref 11.6–15.2)

## 2014-03-16 MED ORDER — DEXTROSE 5 % IV SOLN: 1.0000 g | INTRAVENOUS | Status: DC

## 2014-03-16 MED ORDER — SODIUM CHLORIDE 0.9 % IV SOLN: INTRAVENOUS | Status: DC

## 2014-03-16 MED ORDER — POTASSIUM CHLORIDE IN NACL 40-0.9 MEQ/L-% IV SOLN
INTRAVENOUS | Status: DC
  Administered 2014-03-16 – 2014-03-19 (×7): 100 mL/h via INTRAVENOUS
  Filled 2014-03-16 (×5): qty 1000

## 2014-03-16 MED ORDER — ENOXAPARIN SODIUM 30 MG/0.3ML ~~LOC~~ SOLN: 30.0000 mg | SUBCUTANEOUS | Status: DC

## 2014-03-16 MED ORDER — ENOXAPARIN SODIUM 30 MG/0.3ML ~~LOC~~ SOLN
30.0000 mg | SUBCUTANEOUS | Status: DC
  Administered 2014-03-16 – 2014-03-19 (×4): 30 mg via SUBCUTANEOUS
  Filled 2014-03-16 (×4): qty 0.3

## 2014-03-16 MED ORDER — ENSURE COMPLETE PO LIQD
237.0000 mL | Freq: Two times a day (BID) | ORAL | Status: DC
  Administered 2014-03-17 – 2014-03-20 (×4): 237 mL via ORAL

## 2014-03-16 MED ORDER — SODIUM CHLORIDE 0.9 % IJ SOLN
3.0000 mL | Freq: Two times a day (BID) | INTRAMUSCULAR | Status: DC
  Administered 2014-03-17: 3 mL via INTRAVENOUS

## 2014-03-16 MED ORDER — ONDANSETRON HCL 4 MG/2ML IJ SOLN: 4.0000 mg | Freq: Four times a day (QID) | INTRAMUSCULAR | Status: DC | PRN

## 2014-03-16 MED ORDER — IRBESARTAN 300 MG PO TABS
300.0000 mg | ORAL_TABLET | Freq: Every day | ORAL | Status: DC
  Administered 2014-03-17 – 2014-03-20 (×4): 300 mg via ORAL
  Filled 2014-03-16 (×6): qty 1

## 2014-03-16 MED ORDER — PNEUMOCOCCAL VAC POLYVALENT 25 MCG/0.5ML IJ INJ
0.5000 mL | INJECTION | INTRAMUSCULAR | Status: AC
  Administered 2014-03-17: 0.5 mL via INTRAMUSCULAR
  Filled 2014-03-16: qty 0.5

## 2014-03-16 MED ORDER — CEFTRIAXONE SODIUM IN DEXTROSE 20 MG/ML IV SOLN
1.0000 g | INTRAVENOUS | Status: DC
  Administered 2014-03-16 – 2014-03-19 (×4): 1 g via INTRAVENOUS
  Filled 2014-03-16 (×5): qty 50

## 2014-03-16 MED ORDER — AMLODIPINE BESYLATE 5 MG PO TABS
10.0000 mg | ORAL_TABLET | Freq: Every day | ORAL | Status: DC
  Administered 2014-03-17 – 2014-03-20 (×4): 10 mg via ORAL
  Filled 2014-03-16 (×4): qty 2

## 2014-03-16 MED ORDER — ONDANSETRON HCL 4 MG PO TABS: 4.0000 mg | ORAL_TABLET | Freq: Four times a day (QID) | ORAL | Status: DC | PRN

## 2014-03-16 MED ORDER — POTASSIUM CHLORIDE 10 MEQ/100ML IV SOLN
10.0000 meq | INTRAVENOUS | Status: AC
  Administered 2014-03-16 (×5): 10 meq via INTRAVENOUS
  Filled 2014-03-16 (×4): qty 100

## 2014-03-16 MED ORDER — CEFTRIAXONE SODIUM 1 G IJ SOLR
INTRAMUSCULAR | Status: AC
  Filled 2014-03-16: qty 10

## 2014-03-16 MED ORDER — AMLODIPINE-OLMESARTAN 10-40 MG PO TABS: 1.0000 | ORAL_TABLET | Freq: Every day | ORAL | Status: DC

## 2014-03-16 MED ORDER — SODIUM CHLORIDE 0.9 % IV BOLUS (SEPSIS)
1000.0000 mL | Freq: Once | INTRAVENOUS | Status: AC
  Administered 2014-03-16: 1000 mL via INTRAVENOUS

## 2014-03-16 NOTE — ED Notes (Signed)
Stand-by assist with pt to the bathroom. Pt able to ambulate while holding onto railing and support from this RN. Nad noted.

## 2014-03-16 NOTE — ED Notes (Signed)
Pt unable to void at this time. 

## 2014-03-16 NOTE — ED Notes (Signed)
Pt states diarrhea x 1 week. Was put on Lomotil 2 days ago with no relief. Denies pain. States generalized weakness and dizziness

## 2014-03-16 NOTE — ED Provider Notes (Signed)
CSN: [id number]     Arrival date & time 03/16/14  1426 History  This chart was scribed for Nat Christen, MD by Edison Simon, ED Scribe. This patient was seen in room APA08/APA08 and the patient's care was started at 3:02 PM.    Chief Complaint  Patient presents with  . Diarrhea   The history is provided by the patient. No language interpreter was used.    HPI Comments: Leslie Pope is a 81 y.o. female who presents to the Emergency Department complaining of diarrhea with onset 1 week ago. She describes her diarrhea as brown, liquid, and watery without blood or mucous. Son states that she has diarrhea approximately 5 minutes after eating and states she has lost 10-20 pounds since December. She reports associated generalized weakness and "head swimming" dizziness. Son states she has seemed ill for the past 2.5 months. Per son, she mostly stays in bed but does sometimes get up and looks around. She notes she started Diphenoxylate/Atropine this morning for her symptoms. She states she has not seen her PCP yet for her symptoms, but has an appointment on 1/27. She denies vomiting.  PCP: Rosita Fire, MD  Past Medical History  Diagnosis Date  . Hypertension     Lab: Normal BMet in 11/2010  . Chronic knee pain     status post bilateral TKA  . Deep vein thrombosis 11/2010    following left femoral fracture with closed reduction and immobilization in a knee brace  . Anemia 11/2010    post fracture; transfused 2 units; H&H of 10.6/32.5 on 12/05/10   Past Surgical History  Procedure Laterality Date  . Knee surgery     Family History  Problem Relation Age of Onset  . Hypertension    . Diabetes    . Arthritis     History  Substance Use Topics  . Smoking status: Never Smoker   . Smokeless tobacco: Never Used  . Alcohol Use: No   OB History    No data available     Review of Systems A complete 10 system review of systems was obtained and all systems are negative except as noted in the  HPI and PMH.     Allergies  Review of patient's allergies indicates no known allergies.  Home Medications   Prior to Admission medications   Medication Sig Start Date End Date Taking? Authorizing Provider  AZOR 10-40 MG per tablet Take 1 tablet by mouth daily. 02/20/14  Yes Historical Provider, MD  diphenoxylate-atropine (LOMOTIL) 2.5-0.025 MG per tablet Take 1 tablet by mouth every 6 (six) hours as needed for diarrhea or loose stools.  01/04/14   Historical Provider, MD  warfarin (COUMADIN) 5 MG tablet Take 1 tablet (5 mg total) by mouth as directed. Patient not taking: Reported on 03/16/2014 06/17/11   Yehuda Savannah, MD   BP 94/48 mmHg  Pulse 68  Resp 16  SpO2 96% Physical Exam  Constitutional: She is oriented to person, place, and time. She appears well-developed and well-nourished.  Frail but alert, very lucid Thin and frail  HENT:  Head: Normocephalic and atraumatic.  Eyes: Conjunctivae and EOM are normal. Pupils are equal, round, and reactive to light.  Neck: Normal range of motion. Neck supple.  Cardiovascular: Normal rate and regular rhythm.   Pulmonary/Chest: Effort normal and breath sounds normal.  Abdominal: Soft. Bowel sounds are normal.  Musculoskeletal: Normal range of motion.  Neurological: She is alert and oriented to person, place, and time.  Skin:  Skin is warm and dry.  Psychiatric: She has a normal mood and affect. Her behavior is normal.  Nursing note and vitals reviewed.   ED Course  Procedures (including critical care time)   COORDINATION OF CARE: 3:06 PM Discussed treatment plan with patient at beside, including IV fluids and blood work. The patient agrees with the plan and has no further questions at this time.   Labs Review Labs Reviewed  CBC WITH DIFFERENTIAL - Abnormal; Notable for the following:    Hemoglobin 11.9 (*)    RDW 15.6 (*)    Neutro Abs 7.9 (*)    All other components within normal limits  BASIC METABOLIC PANEL - Abnormal;  Notable for the following:    Potassium 2.1 (*)    Glucose, Bld 105 (*)    BUN 27 (*)    Creatinine, Ser 1.91 (*)    GFR calc non Af Amer 23 (*)    GFR calc Af Amer 26 (*)    Anion gap 17 (*)    All other components within normal limits  PROTIME-INR - Abnormal; Notable for the following:    Prothrombin Time 16.1 (*)    All other components within normal limits  URINALYSIS, ROUTINE W REFLEX MICROSCOPIC  GI PATHOGEN PANEL BY PCR, STOOL    Imaging Review No results found.   EKG Interpretation   Date/Time:  Thursday March 16 2014 14:47:24 EST Ventricular Rate:  104 PR Interval:  113 QRS Duration: 139 QT Interval:  410 QTC Calculation: 539 R Axis:   96 Text Interpretation:  Sinus tachycardia Atrial premature complexes  Anterior infarct, old Repol abnrm suggests ischemia, diffuse leads  Prolonged QT interval Confirmed by Lacinda Axon  MD, Sheretha Shadd ([id number]) on 03/16/2014  3:11:04 PM      MDM   Final diagnoses:  Diarrhea  Hypokalemia  Renal insufficiency    Patient complains of watery diarrhea for several days, weight loss, poor appetite. She is alert but dehydrated. Potassium is 2.1. Creatinine 1.91. Admit for hydration.  Stool sample ordered    Nat Christen, MD 03/16/14 [id number]

## 2014-03-16 NOTE — ED Notes (Signed)
CRITICAL VALUE ALERT  Critical value received:  KT 2.1  Date of notification:  03/16/14  Time of notification:  0865  Critical value read back:Yes.    Nurse who received alert:  Rosalee Kaufman  MD notified (1st page):  1548  Time of first page:  1546  MD notified (2nd page):  Time of second page:  Responding MD:  Lacinda Axon  Time MD responded:  [phone]

## 2014-03-16 NOTE — ED Notes (Signed)
Pt unable to void stated she did not feel very well, bp is 94/48

## 2014-03-16 NOTE — H&P (Signed)
[facility] History and Physical  Leslie Pope HWE:[id number] DOB: 12-08-1925 DOA: 03/16/2014  Referring physician: ER PCP: Rosita Fire, MD   Chief Complaint: Diarrhea, dysuria.  HPI: Leslie Pope is a 81 y.o. female  This is an 80 year old lady who gives a two-week history of diarrhea. She describes several episodes of loose bowels. She has not noticed any blood in the stools. She denies any nausea or vomiting. There is no significant abdominal pain. There is no fever. She also describes urinary difficulties with dysuria, poor urinary stream. She has been feeling somewhat dizzy. She has also lost apparently 10-20 pounds since the last month or so.   Review of Systems:  Apart from symptoms above, all systems negative.  Past Medical History  Diagnosis Date  . Hypertension     Lab: Normal BMet in 11/2010  . Chronic knee pain     status post bilateral TKA  . Deep vein thrombosis 11/2010    following left femoral fracture with closed reduction and immobilization in a knee brace  . Anemia 11/2010    post fracture; transfused 2 units; H&H of 10.6/32.5 on 12/05/10   Past Surgical History  Procedure Laterality Date  . Knee surgery     Social History:  reports that she has never smoked. She has never used smokeless tobacco. She reports that she does not drink alcohol or use illicit drugs.  No Known Allergies  Family History  Problem Relation Age of Onset  . Hypertension    . Diabetes    . Arthritis       Prior to Admission medications   Medication Sig Start Date End Date Taking? Authorizing Provider  AZOR 10-40 MG per tablet Take 1 tablet by mouth daily. 02/20/14  Yes Historical Provider, MD  diphenoxylate-atropine (LOMOTIL) 2.5-0.025 MG per tablet Take 1 tablet by mouth every 6 (six) hours as needed for diarrhea or loose stools.  01/04/14   Historical Provider, MD  warfarin (COUMADIN) 5 MG tablet Take 1 tablet (5 mg total) by mouth as directed. Patient not taking:  Reported on 03/16/2014 06/17/11   Yehuda Savannah, MD   Physical Exam: Filed Vitals:   03/16/14 1700 03/16/14 1730 03/16/14 1800 03/16/14 1830  BP: 129/53 118/63 119/94 116/63  Pulse: 86     Resp: 25 18 27 18   SpO2:    100%    Wt Readings from Last 3 Encounters:  06/19/11 80.74 kg (178 lb)  03/20/11 79.379 kg (175 lb)    General:  Appears calm and comfortable. She looks clinically dehydrated. Eyes: PERRL, normal lids, irises & conjunctiva ENT: grossly normal hearing, lips & tongue Neck: no LAD, masses or thyromegaly Cardiovascular: RRR, no m/r/g. No LE edema. Telemetry: SR, no arrhythmias  Respiratory: CTA bilaterally, no w/r/r. Normal respiratory effort. Abdomen: soft, ntnd Skin: no rash or induration seen on limited exam Musculoskeletal: grossly normal tone BUE/BLE Psychiatric: grossly normal mood and affect, speech fluent and appropriate Neurologic: grossly non-focal.          Labs on Admission:  Basic Metabolic Panel:  Recent Labs Lab 03/16/14 1447  NA 144  K 2.1*  CL 105  CO2 22  GLUCOSE 105*  BUN 27*  CREATININE 1.91*  CALCIUM 8.6   Liver Function Tests: No results for input(s): AST, ALT, ALKPHOS, BILITOT, PROT, ALBUMIN in the last 168 hours. No results for input(s): LIPASE, AMYLASE in the last 168 hours. No results for input(s): AMMONIA in the last 168 hours. CBC:  Recent Labs Lab  03/16/14 1447  WBC 10.5  NEUTROABS 7.9*  HGB 11.9*  HCT 36.8  MCV 87.0  PLT 298   Cardiac Enzymes: No results for input(s): CKTOTAL, CKMB, CKMBINDEX, TROPONINI in the last 168 hours.  BNP (last 3 results) No results for input(s): PROBNP in the last 8760 hours. CBG: No results for input(s): GLUCAP in the last 168 hours.  Radiological Exams on Admission: No results found.    Assessment/Plan   1. UTI. Treat with intravenous antibiotics. 2. Diarrheal illness. This may be viral in origin but may also be related to the UTI. Supportive treatment  only. 3. Dehydration and hypokalemia. This is related to the diarrheal illness. She'll be treated with intravenous fluids and potassium supplementation. 4. Hypertension. This appears to be stable. Will monitor closely.  Further recommendations will depend on patient's hospital progress.   Code Status: Full code.  DVT Prophylaxis: Lovenox.  Family Communication: I discussed the plan with the patient at the bedside.   Disposition Plan: Home when medically stable.  Time spent: 60 minutes.  Doree Albee [facility] Pager [phone].

## 2014-03-17 DIAGNOSIS — E876 Hypokalemia: Secondary | ICD-10-CM | POA: Diagnosis not present

## 2014-03-17 DIAGNOSIS — I10 Essential (primary) hypertension: Secondary | ICD-10-CM | POA: Diagnosis not present

## 2014-03-17 DIAGNOSIS — Z833 Family history of diabetes mellitus: Secondary | ICD-10-CM | POA: Diagnosis not present

## 2014-03-17 DIAGNOSIS — E860 Dehydration: Secondary | ICD-10-CM | POA: Diagnosis not present

## 2014-03-17 DIAGNOSIS — Z8249 Family history of ischemic heart disease and other diseases of the circulatory system: Secondary | ICD-10-CM | POA: Diagnosis not present

## 2014-03-17 DIAGNOSIS — N390 Urinary tract infection, site not specified: Secondary | ICD-10-CM | POA: Diagnosis not present

## 2014-03-17 LAB — COMPREHENSIVE METABOLIC PANEL
ALBUMIN: 2.9 g/dL — AB (ref 3.5–5.2)
ALK PHOS: 71 U/L (ref 39–117)
ALT: 23 U/L (ref 0–35)
AST: 29 U/L (ref 0–37)
Anion gap: 12 (ref 5–15)
BUN: 24 mg/dL — AB (ref 6–23)
CALCIUM: 8 mg/dL — AB (ref 8.4–10.5)
CO2: 20 mmol/L (ref 19–32)
CREATININE: 1.52 mg/dL — AB (ref 0.50–1.10)
Chloride: 113 mEq/L — ABNORMAL HIGH (ref 96–112)
GFR calc Af Amer: 35 mL/min — ABNORMAL LOW (ref >90)
GFR, EST NON AFRICAN AMERICAN: 30 mL/min — AB (ref >90)
Glucose, Bld: 87 mg/dL (ref 70–99)
POTASSIUM: 2.8 mmol/L — AB (ref 3.5–5.1)
Sodium: 145 mmol/L (ref 135–145)
TOTAL PROTEIN: 5.7 g/dL — AB (ref 6.0–8.3)
Total Bilirubin: 1 mg/dL (ref 0.3–1.2)

## 2014-03-17 LAB — CBC
HEMATOCRIT: 35.5 % — AB (ref 36.0–46.0)
Hemoglobin: 11.8 g/dL — ABNORMAL LOW (ref 12.0–15.0)
MCH: 28.9 pg (ref 26.0–34.0)
MCHC: 33.2 g/dL (ref 30.0–36.0)
MCV: 87 fL (ref 78.0–100.0)
PLATELETS: 287 10*3/uL (ref 150–400)
RBC: 4.08 MIL/uL (ref 3.87–5.11)
RDW: 15.9 % — ABNORMAL HIGH (ref 11.5–15.5)
WBC: 11.5 10*3/uL — AB (ref 4.0–10.5)

## 2014-03-17 MED ORDER — POTASSIUM CHLORIDE 10 MEQ/100ML IV SOLN
10.0000 meq | INTRAVENOUS | Status: AC
  Administered 2014-03-17 (×4): 10 meq via INTRAVENOUS
  Filled 2014-03-17 (×4): qty 100

## 2014-03-17 NOTE — Care Management Note (Addendum)
    Page 1 of 1   03/20/2014     11:53:30 AM CARE MANAGEMENT NOTE 03/20/2014  Patient:  Leslie Pope, Leslie Pope   Account Number:  [id number]  Date Initiated:  03/17/2014  Documentation initiated by:  Jolene Provost  Subjective/Objective Assessment:   Pt is from home, lives with son and is independent with ADL's. Pt uses a can or walker when needed. Pt has no other DME's, [place] services, or med needs prior to admission. Pt plans to dsicharge home with self care.     Action/Plan:   No CM needs identified at this time.   Anticipated DC Date:  03/20/2014   Anticipated DC Plan:  [place]  CM consult      Choice offered to / List presented to:             Status of service:  Completed, signed off Medicare Important Message given?  YES (If response is "NO", the following Medicare IM given date fields will be blank) Date Medicare IM given:  03/17/2014 Medicare IM given by:  Vladimir Creeks Date Additional Medicare IM given:  03/20/2014 Additional Medicare IM given by:  JESSICA CHILDRESS  Discharge Disposition:  HOME/SELF CARE  Per UR Regulation:    If discussed at Long Length of Stay Meetings, dates discussed:    Comments:  03/20/2014 [place], RN, MSN, PCCN Pt is being discharged home with self care. Pt has no CM needs at the time of discharge. 03/17/2014 [place], RN, MSN, [employer]

## 2014-03-17 NOTE — Progress Notes (Signed)
Subjective: Patient was admitted yesterday due to hypokalemia, diarrhea and UTI. She is started on IV antibiotics and iv fluid with KCL. Patient feels better today and her K+ is improving.  Objective: Vital signs in last 24 hours: Temp:  [97.8 F (36.6 C)] 97.8 F (36.6 C) (01/22 1067) Pulse Rate:  [68-95] 94 (01/22 0613) Resp:  [16-27] 18 (01/22 0613) BP: (94-138)/(45-94) 121/45 mmHg (01/22 0613) SpO2:  [96 %-100 %] 100 % (01/22 7616) Weight:  [55.157 kg (121 lb 9.6 oz)] 55.157 kg (121 lb 9.6 oz) (01/21 2022) Weight change:  Last BM Date: 03/17/14  Intake/Output from previous day: 01/21 0701 - 01/22 0700 In: 1893.3 [I.V.:1843.3; IV Piggyback:50] Out: 250 [Urine:250]  PHYSICAL EXAM General appearance: alert and no distress Resp: clear to auscultation bilaterally Cardio: S1, S2 normal GI: soft, non-tender; bowel sounds normal; no masses,  no organomegaly Extremities: extremities normal, atraumatic, no cyanosis or edema  Lab Results:  Results for orders placed or performed during the hospital encounter of 03/16/14 (from the past 48 hour(s))  CBC with Differential     Status: Abnormal   Collection Time: 03/16/14  2:47 PM  Result Value Ref Range   WBC 10.5 4.0 - 10.5 K/uL   RBC 4.23 3.87 - 5.11 MIL/uL   Hemoglobin 11.9 (L) 12.0 - 15.0 g/dL   HCT 07.6 06.6 - 78.5 %   MCV 87.0 78.0 - 100.0 fL   MCH 28.1 26.0 - 34.0 pg   MCHC 32.3 30.0 - 36.0 g/dL   RDW 54.7 (H) 68.9 - 15.5 %   Platelets 298 150 - 400 K/uL   Neutrophils Relative % 75 43 - 77 %   Neutro Abs 7.9 (H) 1.7 - 7.7 K/uL   Lymphocytes Relative 16 12 - 46 %   Lymphs Abs 1.6 0.7 - 4.0 K/uL   Monocytes Relative 9 3 - 12 %   Monocytes Absolute 0.9 0.1 - 1.0 K/uL   Eosinophils Relative 0 0 - 5 %   Eosinophils Absolute 0.0 0.0 - 0.7 K/uL   Basophils Relative 0 0 - 1 %   Basophils Absolute 0.0 0.0 - 0.1 K/uL  Basic metabolic panel     Status: Abnormal   Collection Time: 03/16/14  2:47 PM  Result Value Ref Range   Sodium 144 135 - 145 mmol/L    Comment: Please note change in reference range.   Potassium 2.1 (LL) 3.5 - 5.1 mmol/L    Comment: CRITICAL RESULT CALLED TO, READ BACK BY AND VERIFIED WITH: COCKRAM,R ON 03/16/14 AT 1545 BY LOY,C Please note change in reference range.    Chloride 105 96 - 112 mEq/L   CO2 22 19 - 32 mmol/L   Glucose, Bld 105 (H) 70 - 99 mg/dL   BUN 27 (H) 6 - 23 mg/dL   Creatinine, Ser 2.53 (H) 0.50 - 1.10 mg/dL   Calcium 8.6 8.4 - 64.8 mg/dL   GFR calc non Af Amer 23 (L) >90 mL/min   GFR calc Af Amer 26 (L) >90 mL/min    Comment: (NOTE) The eGFR has been calculated using the CKD EPI equation. This calculation has not been validated in all clinical situations. eGFR's persistently <90 mL/min signify possible Chronic Kidney Disease.    Anion gap 17 (H) 5 - 15  Protime-INR     Status: Abnormal   Collection Time: 03/16/14  2:47 PM  Result Value Ref Range   Prothrombin Time 16.1 (H) 11.6 - 15.2 seconds   INR 1.28 0.00 -  1.49  Urinalysis, Routine w reflex microscopic     Status: Abnormal   Collection Time: 03/16/14  6:04 PM  Result Value Ref Range   Color, Urine YELLOW YELLOW   APPearance HAZY (A) CLEAR   Specific Gravity, Urine >1.030 (H) 1.005 - 1.030   pH 6.0 5.0 - 8.0   Glucose, UA NEGATIVE NEGATIVE mg/dL   Hgb urine dipstick SMALL (A) NEGATIVE   Bilirubin Urine SMALL (A) NEGATIVE   Ketones, ur TRACE (A) NEGATIVE mg/dL   Protein, ur 30 (A) NEGATIVE mg/dL   Urobilinogen, UA 0.2 0.0 - 1.0 mg/dL   Nitrite POSITIVE (A) NEGATIVE   Leukocytes, UA LARGE (A) NEGATIVE  Urine microscopic-add on     Status: Abnormal   Collection Time: 03/16/14  6:04 PM  Result Value Ref Range   Squamous Epithelial / LPF MANY (A) RARE   WBC, UA TOO NUMEROUS TO COUNT <3 WBC/hpf   RBC / HPF 0-2 <3 RBC/hpf   Bacteria, UA MANY (A) RARE  Comprehensive metabolic panel     Status: Abnormal   Collection Time: 03/17/14  6:39 AM  Result Value Ref Range   Sodium 145 135 - 145 mmol/L     Comment: Please note change in reference range.   Potassium 2.8 (L) 3.5 - 5.1 mmol/L    Comment: DELTA CHECK NOTED Please note change in reference range.    Chloride 113 (H) 96 - 112 mEq/L   CO2 20 19 - 32 mmol/L   Glucose, Bld 87 70 - 99 mg/dL   BUN 24 (H) 6 - 23 mg/dL   Creatinine, Ser 1.52 (H) 0.50 - 1.10 mg/dL   Calcium 8.0 (L) 8.4 - 10.5 mg/dL   Total Protein 5.7 (L) 6.0 - 8.3 g/dL   Albumin 2.9 (L) 3.5 - 5.2 g/dL   AST 29 0 - 37 U/L   ALT 23 0 - 35 U/L   Alkaline Phosphatase 71 39 - 117 U/L   Total Bilirubin 1.0 0.3 - 1.2 mg/dL   GFR calc non Af Amer 30 (L) >90 mL/min   GFR calc Af Amer 35 (L) >90 mL/min    Comment: (NOTE) The eGFR has been calculated using the CKD EPI equation. This calculation has not been validated in all clinical situations. eGFR's persistently <90 mL/min signify possible Chronic Kidney Disease.    Anion gap 12 5 - 15  CBC     Status: Abnormal   Collection Time: 03/17/14  6:39 AM  Result Value Ref Range   WBC 11.5 (H) 4.0 - 10.5 K/uL   RBC 4.08 3.87 - 5.11 MIL/uL   Hemoglobin 11.8 (L) 12.0 - 15.0 g/dL   HCT 35.5 (L) 36.0 - 46.0 %   MCV 87.0 78.0 - 100.0 fL   MCH 28.9 26.0 - 34.0 pg   MCHC 33.2 30.0 - 36.0 g/dL   RDW 15.9 (H) 11.5 - 15.5 %   Platelets 287 150 - 400 K/uL    ABGS No results for input(s): PHART, PO2ART, TCO2, HCO3 in the last 72 hours.  Invalid input(s): PCO2 CULTURES No results found for this or any previous visit (from the past 240 hour(s)). Studies/Results: No results found.  Medications: I have reviewed the patient's current medications.  Assesment:   Active Problems:   Hypokalemia   UTI (lower urinary tract infection)   Diarrhea   Dehydration    Plan:  Medications reviewed Continue IV antibiotic and Iv fluid Will add KCL IV runs Will monitor BMP  LOS: 1 day   Jayshawn Colston 03/17/2014, 8:31 AM

## 2014-03-17 NOTE — Progress Notes (Signed)
INITIAL NUTRITION ASSESSMENT   Pt meets criteria for severe MALNUTRITION in the context of acute illness as evidenced by moderate wasting to temporal and anterior thigh and fat mass depletion to thoracic lumbar regions.  INTERVENTION: Ensure Complete po BID, each supplement provides 350 kcal and 13 grams of protein   RD to follow for nutrition care plan  NUTRITION DIAGNOSIS: Inadequate oral intake related to poor appetite as evidenced by un-planned weight loss (31%) over past 2+ years and moderate wasting to temporal and anterior thigh and fat mass depletion to thoracic lumbar regions.   Goal: Pt to meet >/= 90% of their estimated nutrition needs    Monitor:  Po intake (meals and supplements), labs and wt trends   Reason for Assessment: Malnutrition Screen   81 y.o. female  ASSESSMENT: Pt presents with diarrhea, UTI, and hypokcalemia. She was dehydrated on admission and reporting 10-20# weight loss in the past month and 31% decrease over past 2+ years. Pt says she just has not had any appetite over the past 3-4 weeks.  Nutrition Focused Physical Exam:  Subcutaneous Fat:  Orbital Region: mild depleiton Upper Arm Region: severe depletion Thoracic and Lumbar Region: moderate depleiton  Muscle:  Temple Region: mild wasting  Clavicle Bone Region: severe wasting Clavicle and Acromion Bone Region: severe wasting Scapular Bone Region: moderate wasting Dorsal Hand: moderate wasting Patellar Region: moderate wasting Anterior Thigh Region: moderate wasting Posterior Calf Region: not assessed  Edema: mild to lower extremity    Height: Ht Readings from Last 1 Encounters:  03/16/14 5\' 5"  (1.651 m)    Weight: Wt Readings from Last 1 Encounters:  03/16/14 121 lb 9.6 oz (55.157 kg)    Ideal Body Weight: 125#  % Ideal Body Weight: 98%  Wt Readings from Last 10 Encounters:  03/16/14 121 lb 9.6 oz (55.157 kg)  06/19/11 178 lb (80.74 kg)  03/20/11 175 lb (79.379 kg)     Usual Body Weight: 175#  % Usual Body Weight: 69%  BMI:  Body mass index is 20.24 kg/(m^2).normal range  Estimated Nutritional Needs: Kcal: 1650-1800 Protein: 70 gr Fluid: 1.7-1.8 liters daily  Skin: no breakdown per nursing  Diet Order: Diet full liquid  EDUCATION NEEDS: -No education needs identified at this time   Intake/Output Summary (Last 24 hours) at 03/17/14 0527 Last data filed at 03/16/14 2015  Gross per 24 hour  Intake   1000 ml  Output     50 ml  Net    950 ml    Last BM: 1/22 diarrhea  Labs:   Recent Labs Lab 03/16/14 1447  NA 144  K 2.1*  CL 105  CO2 22  BUN 27*  CREATININE 1.91*  CALCIUM 8.6  GLUCOSE 105*    CBG (last 3)  No results for input(s): GLUCAP in the last 72 hours.  Scheduled Meds: . amLODipine  10 mg Oral Daily   And  . irbesartan  300 mg Oral Daily  . cefTRIAXone (ROCEPHIN)  IV  1 g Intravenous Q24H  . enoxaparin (LOVENOX) injection  30 mg Subcutaneous Q24H  . feeding supplement (ENSURE COMPLETE)  237 mL Oral BID BM  . pneumococcal 23 valent vaccine  0.5 mL Intramuscular Tomorrow-1000  . sodium chloride  3 mL Intravenous Q12H    Continuous Infusions: . 0.9 % NaCl with KCl 40 mEq / L 100 mL/hr (03/16/14 2205)    Past Medical History  Diagnosis Date  . Hypertension     Lab: Normal BMet in 11/2010  .  Chronic knee pain     status post bilateral TKA  . Deep vein thrombosis 11/2010    following left femoral fracture with closed reduction and immobilization in a knee brace  . Anemia 11/2010    post fracture; transfused 2 units; H&H of 10.6/32.5 on 12/05/10    Past Surgical History  Procedure Laterality Date  . Knee surgery      Colman Cater MS,RD,CSG,LDN Office: #[phone] Pager: [phone]

## 2014-03-17 NOTE — Care Management Utilization Note (Signed)
UR completed 

## 2014-03-18 LAB — BASIC METABOLIC PANEL
Anion gap: 7 (ref 5–15)
BUN: 19 mg/dL (ref 6–23)
CHLORIDE: 119 mmol/L — AB (ref 96–112)
CO2: 20 mmol/L (ref 19–32)
Calcium: 8.4 mg/dL (ref 8.4–10.5)
Creatinine, Ser: 1.27 mg/dL — ABNORMAL HIGH (ref 0.50–1.10)
GFR calc Af Amer: 43 mL/min — ABNORMAL LOW (ref >90)
GFR calc non Af Amer: 37 mL/min — ABNORMAL LOW (ref >90)
Glucose, Bld: 90 mg/dL (ref 70–99)
POTASSIUM: 4.2 mmol/L (ref 3.5–5.1)
Sodium: 146 mmol/L — ABNORMAL HIGH (ref 135–145)

## 2014-03-18 LAB — CLOSTRIDIUM DIFFICILE BY PCR: Toxigenic C. Difficile by PCR: NEGATIVE

## 2014-03-18 NOTE — Progress Notes (Signed)
Subjective: She says she feels better and wants to go home. She is however still having a significant amount of loose stool. She has a pending enteric pathogen panel and will be checked by PCR for C. difficile  Objective: Vital signs in last 24 hours: Temp:  [97.4 F (36.3 C)-97.7 F (36.5 C)] 97.7 F (36.5 C) (01/23 0521) Pulse Rate:  [95-108] 108 (01/23 0521) Resp:  [18] 18 (01/23 0521) BP: (100-127)/(51-60) 127/51 mmHg (01/23 0521) SpO2:  [100 %] 100 % (01/23 0521) Weight change:  Last BM Date: 03/18/14  Intake/Output from previous day: 01/22 0701 - 01/23 0700 In: 4039.7 [P.O.:1230; I.V.:2359.7; IV Piggyback:450] Out: 14 [Urine:7; Stool:7]  PHYSICAL EXAM General appearance: alert, cooperative and mild distress Resp: clear to auscultation bilaterally Cardio: regular rate and rhythm, S1, S2 normal, no murmur, click, rub or gallop GI: Mildly diffusely tender with hyperactive bowel sounds Extremities: extremities normal, atraumatic, no cyanosis or edema  Lab Results:  Results for orders placed or performed during the hospital encounter of 03/16/14 (from the past 48 hour(s))  CBC with Differential     Status: Abnormal   Collection Time: 03/16/14  2:47 PM  Result Value Ref Range   WBC 10.5 4.0 - 10.5 K/uL   RBC 4.23 3.87 - 5.11 MIL/uL   Hemoglobin 11.9 (L) 12.0 - 15.0 g/dL   HCT 36.8 36.0 - 46.0 %   MCV 87.0 78.0 - 100.0 fL   MCH 28.1 26.0 - 34.0 pg   MCHC 32.3 30.0 - 36.0 g/dL   RDW 15.6 (H) 11.5 - 15.5 %   Platelets 298 150 - 400 K/uL   Neutrophils Relative % 75 43 - 77 %   Neutro Abs 7.9 (H) 1.7 - 7.7 K/uL   Lymphocytes Relative 16 12 - 46 %   Lymphs Abs 1.6 0.7 - 4.0 K/uL   Monocytes Relative 9 3 - 12 %   Monocytes Absolute 0.9 0.1 - 1.0 K/uL   Eosinophils Relative 0 0 - 5 %   Eosinophils Absolute 0.0 0.0 - 0.7 K/uL   Basophils Relative 0 0 - 1 %   Basophils Absolute 0.0 0.0 - 0.1 K/uL  Basic metabolic panel     Status: Abnormal   Collection Time: 03/16/14  2:47  PM  Result Value Ref Range   Sodium 144 135 - 145 mmol/L    Comment: Please note change in reference range.   Potassium 2.1 (LL) 3.5 - 5.1 mmol/L    Comment: CRITICAL RESULT CALLED TO, READ BACK BY AND VERIFIED WITH: COCKRAM,R ON 03/16/14 AT 1545 BY LOY,C Please note change in reference range.    Chloride 105 96 - 112 mEq/L   CO2 22 19 - 32 mmol/L   Glucose, Bld 105 (H) 70 - 99 mg/dL   BUN 27 (H) 6 - 23 mg/dL   Creatinine, Ser 1.91 (H) 0.50 - 1.10 mg/dL   Calcium 8.6 8.4 - 10.5 mg/dL   GFR calc non Af Amer 23 (L) >90 mL/min   GFR calc Af Amer 26 (L) >90 mL/min    Comment: (NOTE) The eGFR has been calculated using the CKD EPI equation. This calculation has not been validated in all clinical situations. eGFR's persistently <90 mL/min signify possible Chronic Kidney Disease.    Anion gap 17 (H) 5 - 15  Protime-INR     Status: Abnormal   Collection Time: 03/16/14  2:47 PM  Result Value Ref Range   Prothrombin Time 16.1 (H) 11.6 - 15.2 seconds  INR 1.28 0.00 - 1.49  Urinalysis, Routine w reflex microscopic     Status: Abnormal   Collection Time: 03/16/14  6:04 PM  Result Value Ref Range   Color, Urine YELLOW YELLOW   APPearance HAZY (A) CLEAR   Specific Gravity, Urine >1.030 (H) 1.005 - 1.030   pH 6.0 5.0 - 8.0   Glucose, UA NEGATIVE NEGATIVE mg/dL   Hgb urine dipstick SMALL (A) NEGATIVE   Bilirubin Urine SMALL (A) NEGATIVE   Ketones, ur TRACE (A) NEGATIVE mg/dL   Protein, ur 30 (A) NEGATIVE mg/dL   Urobilinogen, UA 0.2 0.0 - 1.0 mg/dL   Nitrite POSITIVE (A) NEGATIVE   Leukocytes, UA LARGE (A) NEGATIVE  Urine microscopic-add on     Status: Abnormal   Collection Time: 03/16/14  6:04 PM  Result Value Ref Range   Squamous Epithelial / LPF MANY (A) RARE   WBC, UA TOO NUMEROUS TO COUNT <3 WBC/hpf   RBC / HPF 0-2 <3 RBC/hpf   Bacteria, UA MANY (A) RARE  Comprehensive metabolic panel     Status: Abnormal   Collection Time: 03/17/14  6:39 AM  Result Value Ref Range   Sodium  145 135 - 145 mmol/L    Comment: Please note change in reference range.   Potassium 2.8 (L) 3.5 - 5.1 mmol/L    Comment: DELTA CHECK NOTED Please note change in reference range.    Chloride 113 (H) 96 - 112 mEq/L   CO2 20 19 - 32 mmol/L   Glucose, Bld 87 70 - 99 mg/dL   BUN 24 (H) 6 - 23 mg/dL   Creatinine, Ser 1.52 (H) 0.50 - 1.10 mg/dL   Calcium 8.0 (L) 8.4 - 10.5 mg/dL   Total Protein 5.7 (L) 6.0 - 8.3 g/dL   Albumin 2.9 (L) 3.5 - 5.2 g/dL   AST 29 0 - 37 U/L   ALT 23 0 - 35 U/L   Alkaline Phosphatase 71 39 - 117 U/L   Total Bilirubin 1.0 0.3 - 1.2 mg/dL   GFR calc non Af Amer 30 (L) >90 mL/min   GFR calc Af Amer 35 (L) >90 mL/min    Comment: (NOTE) The eGFR has been calculated using the CKD EPI equation. This calculation has not been validated in all clinical situations. eGFR's persistently <90 mL/min signify possible Chronic Kidney Disease.    Anion gap 12 5 - 15  CBC     Status: Abnormal   Collection Time: 03/17/14  6:39 AM  Result Value Ref Range   WBC 11.5 (H) 4.0 - 10.5 K/uL   RBC 4.08 3.87 - 5.11 MIL/uL   Hemoglobin 11.8 (L) 12.0 - 15.0 g/dL   HCT 35.5 (L) 36.0 - 46.0 %   MCV 87.0 78.0 - 100.0 fL   MCH 28.9 26.0 - 34.0 pg   MCHC 33.2 30.0 - 36.0 g/dL   RDW 15.9 (H) 11.5 - 15.5 %   Platelets 287 150 - 400 K/uL  Basic metabolic panel     Status: Abnormal   Collection Time: 03/18/14  6:54 AM  Result Value Ref Range   Sodium 146 (H) 135 - 145 mmol/L   Potassium 4.2 3.5 - 5.1 mmol/L    Comment: DELTA CHECK NOTED   Chloride 119 (H) 96 - 112 mmol/L   CO2 20 19 - 32 mmol/L   Glucose, Bld 90 70 - 99 mg/dL   BUN 19 6 - 23 mg/dL   Creatinine, Ser 1.27 (H) 0.50 - 1.10 mg/dL  Calcium 8.4 8.4 - 10.5 mg/dL   GFR calc non Af Amer 37 (L) >90 mL/min   GFR calc Af Amer 43 (L) >90 mL/min    Comment: (NOTE) The eGFR has been calculated using the CKD EPI equation. This calculation has not been validated in all clinical situations. eGFR's persistently <90 mL/min signify  possible Chronic Kidney Disease.    Anion gap 7 5 - 15    ABGS No results for input(s): PHART, PO2ART, TCO2, HCO3 in the last 72 hours.  Invalid input(s): PCO2 CULTURES No results found for this or any previous visit (from the past 240 hour(s)). Studies/Results: No results found.  Medications:  Prior to Admission:  Prescriptions prior to admission  Medication Sig Dispense Refill Last Dose  . AZOR 10-40 MG per tablet Take 1 tablet by mouth daily.  3 03/16/2014 at Unknown time  . diphenoxylate-atropine (LOMOTIL) 2.5-0.025 MG per tablet Take 1 tablet by mouth every 6 (six) hours as needed for diarrhea or loose stools.   0 unknown  . warfarin (COUMADIN) 5 MG tablet Take 1 tablet (5 mg total) by mouth as directed. (Patient not taking: Reported on 03/16/2014) 45 tablet 3 Taking   Scheduled: . amLODipine  10 mg Oral Daily   And  . irbesartan  300 mg Oral Daily  . cefTRIAXone (ROCEPHIN)  IV  1 g Intravenous Q24H  . enoxaparin (LOVENOX) injection  30 mg Subcutaneous Q24H  . feeding supplement (ENSURE COMPLETE)  237 mL Oral BID BM  . sodium chloride  3 mL Intravenous Q12H   Continuous: . 0.9 % NaCl with KCl 40 mEq / L 100 mL/hr (03/18/14 0944)   FVO:[id number] **OR** ondansetron (ZOFRAN) IV  Assesment: She was admitted with probable urinary tract infection for which she is being treated with Rocephin. She has diarrhea which has continued and her enteric pathogen panel and C. difficile are pending. She has been dehydrated. She is improving but slowly and she is not ready for discharge Active Problems:   Hypokalemia   UTI (lower urinary tract infection)   Diarrhea   Dehydration    Plan: Advance her diet slightly and see if she gets any better with that    LOS: 2 days   Corrine Tillis L 03/18/2014, 10:15 AM

## 2014-03-19 MED ORDER — LOPERAMIDE HCL 2 MG PO CAPS
2.0000 mg | ORAL_CAPSULE | ORAL | Status: DC | PRN
  Administered 2014-03-19 – 2014-03-20 (×3): 2 mg via ORAL
  Filled 2014-03-19 (×3): qty 1

## 2014-03-19 NOTE — Progress Notes (Signed)
Subjective: She says she feels better. She is still having some loose stools but less. She was able to eat and keep her food down.  Objective: Vital signs in last 24 hours: Temp:  [97.9 F (36.6 C)-98.1 F (36.7 C)] 98 F (36.7 C) (01/24 0539) Pulse Rate:  [109-114] 112 (01/24 0539) Resp:  [18] 18 (01/24 0539) BP: (106-120)/(53-59) 120/59 mmHg (01/24 0539) SpO2:  [97 %-100 %] 97 % (01/24 0539) Weight change:  Last BM Date: 03/19/14  Intake/Output from previous day: 01/23 0701 - 01/24 0700 In: 3178.3 [P.O.:810; I.V.:2318.3; IV Piggyback:50] Out: 4 [Urine:2; Stool:2]  PHYSICAL EXAM General appearance: alert, cooperative and no distress Resp: clear to auscultation bilaterally Cardio: regular rate and rhythm, S1, S2 normal, no murmur, click, rub or gallop GI: Her abdomen is soft and her bowel sounds are still mildly hyperactive Extremities: extremities normal, atraumatic, no cyanosis or edema  Lab Results:  Results for orders placed or performed during the hospital encounter of 03/16/14 (from the past 48 hour(s))  Basic metabolic panel     Status: Abnormal   Collection Time: 03/18/14  6:54 AM  Result Value Ref Range   Sodium 146 (H) 135 - 145 mmol/Pope   Potassium 4.2 3.5 - 5.1 mmol/Pope    Comment: DELTA CHECK NOTED   Chloride 119 (H) 96 - 112 mmol/Pope   CO2 20 19 - 32 mmol/Pope   Glucose, Bld 90 70 - 99 mg/dL   BUN 19 6 - 23 mg/dL   Creatinine, Ser 1.27 (H) 0.50 - 1.10 mg/dL   Calcium 8.4 8.4 - 10.5 mg/dL   GFR calc non Af Amer 37 (Pope) >90 mL/min   GFR calc Af Amer 43 (Pope) >90 mL/min    Comment: (NOTE) The eGFR has been calculated using the CKD EPI equation. This calculation has not been validated in all clinical situations. eGFR's persistently <90 mL/min signify possible Chronic Kidney Disease.    Anion gap 7 5 - 15  Clostridium Difficile by PCR     Status: None   Collection Time: 03/18/14 10:38 AM  Result Value Ref Range   C difficile by pcr NEGATIVE NEGATIVE    ABGS No  results for input(s): PHART, PO2ART, TCO2, HCO3 in the last 72 hours.  Invalid input(s): PCO2 CULTURES Recent Results (from the past 240 hour(s))  Clostridium Difficile by PCR     Status: None   Collection Time: 03/18/14 10:38 AM  Result Value Ref Range Status   C difficile by pcr NEGATIVE NEGATIVE Final   Studies/Results: No results found.  Medications:  Prior to Admission:  Prescriptions prior to admission  Medication Sig Dispense Refill Last Dose  . AZOR 10-40 MG per tablet Take 1 tablet by mouth daily.  3 03/16/2014 at Unknown time  . diphenoxylate-atropine (LOMOTIL) 2.5-0.025 MG per tablet Take 1 tablet by mouth every 6 (six) hours as needed for diarrhea or loose stools.   0 unknown  . warfarin (COUMADIN) 5 MG tablet Take 1 tablet (5 mg total) by mouth as directed. (Patient not taking: Reported on 03/16/2014) 45 tablet 3 Taking   Scheduled: . amLODipine  10 mg Oral Daily   And  . irbesartan  300 mg Oral Daily  . cefTRIAXone (ROCEPHIN)  IV  1 g Intravenous Q24H  . enoxaparin (LOVENOX) injection  30 mg Subcutaneous Q24H  . feeding supplement (ENSURE COMPLETE)  237 mL Oral BID BM  . sodium chloride  3 mL Intravenous Q12H   Continuous: . 0.9 % NaCl with  KCl 40 mEq / Pope 100 mL/hr (03/19/14 0738)   XAJ:LUNGBMBOMQT **OR** ondansetron (ZOFRAN) IV  Assesment: She was admitted with dehydration from diarrhea and presumed urinary tract infection. Her potassium level was low and is being replaced and is better. She has improved. Active Problems:   Hypokalemia   UTI (lower urinary tract infection)   Diarrhea   Dehydration    Plan: I don't think she is quite ready for discharge but may be in the next 24 hours    LOS: 3 days   Leslie Pope 03/19/2014, 10:29 AM

## 2014-03-20 LAB — BASIC METABOLIC PANEL
Anion gap: 6 (ref 5–15)
BUN: 13 mg/dL (ref 6–23)
CO2: 18 mmol/L — ABNORMAL LOW (ref 19–32)
CREATININE: 1.18 mg/dL — AB (ref 0.50–1.10)
Calcium: 8.4 mg/dL (ref 8.4–10.5)
Chloride: 121 mmol/L — ABNORMAL HIGH (ref 96–112)
GFR calc Af Amer: 47 mL/min — ABNORMAL LOW (ref >90)
GFR calc non Af Amer: 41 mL/min — ABNORMAL LOW (ref >90)
GLUCOSE: 70 mg/dL (ref 70–99)
POTASSIUM: 4.7 mmol/L (ref 3.5–5.1)
Sodium: 145 mmol/L (ref 135–145)

## 2014-03-20 MED ORDER — CIPROFLOXACIN HCL 500 MG PO TABS: 500.0000 mg | ORAL_TABLET | Freq: Two times a day (BID) | ORAL | Status: AC

## 2014-03-20 NOTE — Clinical Documentation Improvement (Signed)
Possible Clinical Conditions? Severe Malnutrition   Protein Calorie Malnutrition Severe Protein Calorie Malnutrition Other Condition Cannot clinically determine  Supporting Information: INITIAL NUTRITION ASSESSMENT  Pt meets criteria for severe MALNUTRITION in the context of acute illness as evidenced by moderate wasting to temporal and anterior thigh and fat mass depletion to thoracic lumbar regions.  INTERVENTION:  Ensure Complete po BID, each supplement provides 350 kcal and 13 grams of protein  RD to follow for nutrition care plan   Thank You, Alessandra Grout, RN, BSN, CCDS,Clinical Documentation Specialist:  [phone]  [phone]=[facility] [place]- Health Information Management

## 2014-03-20 NOTE — Discharge Summary (Signed)
Physician Discharge Summary  Patient ID: Leslie Pope MRN: [id number] DOB/AGE: [birth year] 81 y.o. Primary Care Physician:Garwood Wentzell, MD Admit date: 03/16/2014 Discharge date: 03/20/2014    Discharge Diagnoses:   Active Problems:   Hypokalemia   UTI (lower urinary tract infection)   Diarrhea   Dehydration     Medication List    STOP taking these medications        warfarin 5 MG tablet  Commonly known as:  COUMADIN      TAKE these medications        AZOR 10-40 MG per tablet  Generic drug:  amLODipine-olmesartan  Take 1 tablet by mouth daily.     ciprofloxacin 500 MG tablet  Commonly known as:  CIPRO  Take 1 tablet (500 mg total) by mouth 2 (two) times daily.     diphenoxylate-atropine 2.5-0.025 MG per tablet  Commonly known as:  LOMOTIL  Take 1 tablet by mouth every 6 (six) hours as needed for diarrhea or loose stools.        Discharged Condition:  improved    Consults: none  Significant Diagnostic Studies: No results found.  Lab Results: Basic Metabolic Panel:  Recent Labs  03/18/14 0654 03/20/14 0530  NA 146* 145  K 4.2 4.7  CL 119* 121*  CO2 20 18*  GLUCOSE 90 70  BUN 19 13  CREATININE 1.27* 1.18*  CALCIUM 8.4 8.4   Liver Function Tests: No results for input(s): AST, ALT, ALKPHOS, BILITOT, PROT, ALBUMIN in the last 72 hours.   CBC: No results for input(s): WBC, NEUTROABS, HGB, HCT, MCV, PLT in the last 72 hours.  Recent Results (from the past 240 hour(s))  Clostridium Difficile by PCR     Status: None   Collection Time: 03/18/14 10:38 AM  Result Value Ref Range Status   C difficile by pcr NEGATIVE NEGATIVE Final     Hospital Course:   This is an 81 years old female with history of multiple medical illnesses was admitted due to generalized weakness and diarrhea. She was found to have UTI and hypokalemia. Her potassium was supplemented. She received iv antibiotics. Stool for C.diffi was negative. Patient improved and discharged in  a stable condition.  Discharge Exam: Blood pressure 119/55, pulse 93, temperature 97.5 F (36.4 C), temperature source Oral, resp. rate 18, height 5\' 5"  (1.651 m), weight 55.157 kg (121 lb 9.6 oz), SpO2 100 %.     Disposition:  home        Follow-up Information    Follow up with [facility], MD In 1 week.   Specialty:  Internal Medicine   Contact information:   [place] [address] [phone]       Signed: Rosita Fire   03/20/2014, 8:11 AM

## 2014-03-20 NOTE — Progress Notes (Signed)
Patient discharged home.  IV removed - WNL.  Reviewed medications, emphasizing importance of completing dose of abx for UTI.  Also educated on proper peri care to prevent further UTI and on foods with potassium to prevent hypokalemia.  Verbalizes understanding.  No questions at this time.  Awaiting arrival of son to take home and will then be assisted off unit by staff.

## 2014-03-21 LAB — GI PATHOGEN PANEL BY PCR, STOOL
C difficile toxin A/B: NOT DETECTED
CRYPTOSPORIDIUM BY PCR: NOT DETECTED
Campylobacter by PCR: NOT DETECTED
E COLI (STEC): NOT DETECTED
E coli (ETEC) LT/ST: NOT DETECTED
E coli 0157 by PCR: NOT DETECTED
G LAMBLIA BY PCR: NOT DETECTED
Norovirus GI/GII: NOT DETECTED
ROTAVIRUS A BY PCR: NOT DETECTED
SHIGELLA BY PCR: NOT DETECTED
Salmonella by PCR: NOT DETECTED

## 2014-03-22 DIAGNOSIS — R197 Diarrhea, unspecified: Secondary | ICD-10-CM | POA: Diagnosis not present

## 2014-03-22 DIAGNOSIS — I10 Essential (primary) hypertension: Secondary | ICD-10-CM | POA: Diagnosis not present

## 2014-03-22 DIAGNOSIS — E860 Dehydration: Secondary | ICD-10-CM | POA: Diagnosis not present

## 2014-03-22 DIAGNOSIS — R739 Hyperglycemia, unspecified: Secondary | ICD-10-CM | POA: Diagnosis not present

## 2014-04-02 ENCOUNTER — Emergency Department (HOSPITAL_COMMUNITY): Payer: Medicare Other

## 2014-04-02 ENCOUNTER — Encounter (HOSPITAL_COMMUNITY): Payer: Self-pay | Admitting: Emergency Medicine

## 2014-04-02 ENCOUNTER — Inpatient Hospital Stay (HOSPITAL_COMMUNITY)
Admission: EM | Admit: 2014-04-02 | Discharge: 2014-04-25 | DRG: 435 | Disposition: E | Payer: Medicare Other | Attending: Internal Medicine | Admitting: Internal Medicine

## 2014-04-02 DIAGNOSIS — I214 Non-ST elevation (NSTEMI) myocardial infarction: Secondary | ICD-10-CM | POA: Diagnosis not present

## 2014-04-02 DIAGNOSIS — R197 Diarrhea, unspecified: Secondary | ICD-10-CM | POA: Diagnosis not present

## 2014-04-02 DIAGNOSIS — E860 Dehydration: Secondary | ICD-10-CM | POA: Diagnosis present

## 2014-04-02 DIAGNOSIS — E876 Hypokalemia: Secondary | ICD-10-CM | POA: Diagnosis present

## 2014-04-02 DIAGNOSIS — R160 Hepatomegaly, not elsewhere classified: Secondary | ICD-10-CM | POA: Diagnosis not present

## 2014-04-02 DIAGNOSIS — R000 Tachycardia, unspecified: Secondary | ICD-10-CM | POA: Diagnosis present

## 2014-04-02 DIAGNOSIS — Z66 Do not resuscitate: Secondary | ICD-10-CM | POA: Diagnosis not present

## 2014-04-02 DIAGNOSIS — R739 Hyperglycemia, unspecified: Secondary | ICD-10-CM | POA: Diagnosis not present

## 2014-04-02 DIAGNOSIS — Z833 Family history of diabetes mellitus: Secondary | ICD-10-CM | POA: Diagnosis not present

## 2014-04-02 DIAGNOSIS — C787 Secondary malignant neoplasm of liver and intrahepatic bile duct: Secondary | ICD-10-CM | POA: Diagnosis not present

## 2014-04-02 DIAGNOSIS — C801 Malignant (primary) neoplasm, unspecified: Secondary | ICD-10-CM | POA: Diagnosis not present

## 2014-04-02 DIAGNOSIS — R1900 Intra-abdominal and pelvic swelling, mass and lump, unspecified site: Secondary | ICD-10-CM | POA: Diagnosis not present

## 2014-04-02 DIAGNOSIS — E872 Acidosis, unspecified: Secondary | ICD-10-CM

## 2014-04-02 DIAGNOSIS — Z8249 Family history of ischemic heart disease and other diseases of the circulatory system: Secondary | ICD-10-CM | POA: Diagnosis not present

## 2014-04-02 DIAGNOSIS — E43 Unspecified severe protein-calorie malnutrition: Secondary | ICD-10-CM | POA: Diagnosis not present

## 2014-04-02 DIAGNOSIS — J984 Other disorders of lung: Secondary | ICD-10-CM | POA: Diagnosis not present

## 2014-04-02 DIAGNOSIS — N179 Acute kidney failure, unspecified: Secondary | ICD-10-CM | POA: Diagnosis not present

## 2014-04-02 DIAGNOSIS — Z681 Body mass index (BMI) 19 or less, adult: Secondary | ICD-10-CM

## 2014-04-02 DIAGNOSIS — I517 Cardiomegaly: Secondary | ICD-10-CM | POA: Diagnosis not present

## 2014-04-02 DIAGNOSIS — Z515 Encounter for palliative care: Secondary | ICD-10-CM | POA: Diagnosis not present

## 2014-04-02 DIAGNOSIS — Z96653 Presence of artificial knee joint, bilateral: Secondary | ICD-10-CM | POA: Diagnosis present

## 2014-04-02 DIAGNOSIS — I10 Essential (primary) hypertension: Secondary | ICD-10-CM | POA: Diagnosis present

## 2014-04-02 DIAGNOSIS — Z86718 Personal history of other venous thrombosis and embolism: Secondary | ICD-10-CM | POA: Diagnosis not present

## 2014-04-02 DIAGNOSIS — R112 Nausea with vomiting, unspecified: Secondary | ICD-10-CM

## 2014-04-02 DIAGNOSIS — R109 Unspecified abdominal pain: Secondary | ICD-10-CM | POA: Diagnosis not present

## 2014-04-02 HISTORY — DX: Unspecified protein-calorie malnutrition: E46

## 2014-04-02 HISTORY — DX: Unspecified osteoarthritis, unspecified site: M19.90

## 2014-04-02 LAB — URINALYSIS, ROUTINE W REFLEX MICROSCOPIC
GLUCOSE, UA: NEGATIVE mg/dL
HGB URINE DIPSTICK: NEGATIVE
Ketones, ur: NEGATIVE mg/dL
LEUKOCYTES UA: NEGATIVE
NITRITE: NEGATIVE
PROTEIN: 30 mg/dL — AB
Urobilinogen, UA: 0.2 mg/dL (ref 0.0–1.0)
pH: 5.5 (ref 5.0–8.0)

## 2014-04-02 LAB — CBC WITH DIFFERENTIAL/PLATELET
BASOS ABS: 0 10*3/uL (ref 0.0–0.1)
Basophils Relative: 0 % (ref 0–1)
EOS ABS: 0 10*3/uL (ref 0.0–0.7)
Eosinophils Relative: 0 % (ref 0–5)
HCT: 38.8 % (ref 36.0–46.0)
HEMOGLOBIN: 12.7 g/dL (ref 12.0–15.0)
Lymphocytes Relative: 10 % — ABNORMAL LOW (ref 12–46)
Lymphs Abs: 1.1 10*3/uL (ref 0.7–4.0)
MCH: 28.3 pg (ref 26.0–34.0)
MCHC: 32.7 g/dL (ref 30.0–36.0)
MCV: 86.6 fL (ref 78.0–100.0)
MONOS PCT: 8 % (ref 3–12)
Monocytes Absolute: 0.8 10*3/uL (ref 0.1–1.0)
NEUTROS ABS: 8.6 10*3/uL — AB (ref 1.7–7.7)
Neutrophils Relative %: 82 % — ABNORMAL HIGH (ref 43–77)
PLATELETS: 250 10*3/uL (ref 150–400)
RBC: 4.48 MIL/uL (ref 3.87–5.11)
RDW: 17.5 % — ABNORMAL HIGH (ref 11.5–15.5)
WBC: 10.5 10*3/uL (ref 4.0–10.5)

## 2014-04-02 LAB — MAGNESIUM: Magnesium: 2.2 mg/dL (ref 1.5–2.5)

## 2014-04-02 LAB — POC OCCULT BLOOD, ED: FECAL OCCULT BLD: POSITIVE — AB

## 2014-04-02 LAB — URINE MICROSCOPIC-ADD ON

## 2014-04-02 LAB — COMPREHENSIVE METABOLIC PANEL
ALT: 33 U/L (ref 0–35)
AST: 47 U/L — ABNORMAL HIGH (ref 0–37)
Albumin: 2.9 g/dL — ABNORMAL LOW (ref 3.5–5.2)
Alkaline Phosphatase: 121 U/L — ABNORMAL HIGH (ref 39–117)
BUN: 35 mg/dL — AB (ref 6–23)
CHLORIDE: 111 mmol/L (ref 96–112)
CO2: 10 mmol/L — AB (ref 19–32)
Calcium: 8.7 mg/dL (ref 8.4–10.5)
Creatinine, Ser: 2.63 mg/dL — ABNORMAL HIGH (ref 0.50–1.10)
GFR calc non Af Amer: 15 mL/min — ABNORMAL LOW (ref >90)
GFR, EST AFRICAN AMERICAN: 18 mL/min — AB (ref >90)
Glucose, Bld: 106 mg/dL — ABNORMAL HIGH (ref 70–99)
Potassium: 3.2 mmol/L — ABNORMAL LOW (ref 3.5–5.1)
SODIUM: 142 mmol/L (ref 135–145)
Total Bilirubin: 1.5 mg/dL — ABNORMAL HIGH (ref 0.3–1.2)
Total Protein: 6.4 g/dL (ref 6.0–8.3)

## 2014-04-02 LAB — LIPASE, BLOOD: Lipase: 22 U/L (ref 11–59)

## 2014-04-02 LAB — TROPONIN I
TROPONIN I: 0.08 ng/mL — AB (ref <0.031)
Troponin I: 0.1 ng/mL — ABNORMAL HIGH (ref <0.031)

## 2014-04-02 LAB — LACTIC ACID, PLASMA
Lactic Acid, Venous: 6.8 mmol/L (ref 0.5–2.0)
Lactic Acid, Venous: 3.9 mmol/L (ref 0.5–2.0)

## 2014-04-02 MED ORDER — SODIUM CHLORIDE 0.9 % IV BOLUS (SEPSIS)
500.0000 mL | Freq: Once | INTRAVENOUS | Status: AC
  Administered 2014-04-02: 500 mL via INTRAVENOUS

## 2014-04-02 MED ORDER — MORPHINE SULFATE 2 MG/ML IJ SOLN
2.0000 mg | INTRAMUSCULAR | Status: DC | PRN
  Administered 2014-04-02 – 2014-04-04 (×7): 2 mg via INTRAVENOUS
  Filled 2014-04-02 (×8): qty 1

## 2014-04-02 MED ORDER — FENTANYL CITRATE 0.05 MG/ML IJ SOLN
6.2500 ug | Freq: Once | INTRAMUSCULAR | Status: AC
  Administered 2014-04-02: 6.5 ug via INTRAVENOUS
  Filled 2014-04-02: qty 2

## 2014-04-02 MED ORDER — SODIUM CHLORIDE 0.9 % IV SOLN
INTRAVENOUS | Status: DC
  Administered 2014-04-02: 15:00:00 via INTRAVENOUS

## 2014-04-02 MED ORDER — POTASSIUM CHLORIDE IN NACL 20-0.9 MEQ/L-% IV SOLN
INTRAVENOUS | Status: DC
  Administered 2014-04-03: 06:00:00 via INTRAVENOUS

## 2014-04-02 MED ORDER — ONDANSETRON HCL 4 MG/2ML IJ SOLN
4.0000 mg | Freq: Four times a day (QID) | INTRAMUSCULAR | Status: DC | PRN
  Administered 2014-04-03 (×2): 4 mg via INTRAVENOUS
  Filled 2014-04-02 (×2): qty 2

## 2014-04-02 MED ORDER — ENOXAPARIN SODIUM 30 MG/0.3ML ~~LOC~~ SOLN: 30.0000 mg | SUBCUTANEOUS | Status: DC

## 2014-04-02 MED ORDER — ENOXAPARIN SODIUM 30 MG/0.3ML ~~LOC~~ SOLN
30.0000 mg | SUBCUTANEOUS | Status: DC
  Administered 2014-04-02 – 2014-04-03 (×2): 30 mg via SUBCUTANEOUS
  Filled 2014-04-02 (×2): qty 0.3

## 2014-04-02 MED ORDER — SODIUM CHLORIDE 0.9 % IJ SOLN: 3.0000 mL | Freq: Two times a day (BID) | INTRAMUSCULAR | Status: DC

## 2014-04-02 MED ORDER — ONDANSETRON HCL 4 MG PO TABS: 4.0000 mg | ORAL_TABLET | Freq: Four times a day (QID) | ORAL | Status: DC | PRN

## 2014-04-02 NOTE — ED Provider Notes (Signed)
CSN: [id number]     Arrival date & time 04/12/2014  1316 History   First MD Initiated Contact with Patient 04/20/2014 1318     Chief Complaint  Patient presents with  . Hypotension      HPI Pt was seen at 1325. Per EMS, pt's family and pt report: c/o pt with gradual onset and persistence of multiple intermittent episodes of N/V/D for the past 3 weeks. Pt was discharged from the hospital 2 weeks ago for this same complaint. States she has been taking lomotil without improvement of her symptoms. Has been unable to tol PO due to N/V. Pt describes her diarrhea as "dark" and "all kinds of colors." Pt's family states pt has been more "weak" and "lethargic" than usual, as well as starting to c/o "abd pain."  EMS felt pt was "unresponsive" on their arrival to the scene, but when they placed her in Trendelenburg position she became responsive/A&O. Denies CP/SOB, no cough, no back pain, no fevers, no black or blood in emesis.     Past Medical History  Diagnosis Date  . Hypertension     Lab: Normal BMet in 11/2010  . Chronic knee pain     status post bilateral TKA  . Deep vein thrombosis 11/2010    following left femoral fracture with closed reduction and immobilization in a knee brace  . Anemia 11/2010    post fracture; transfused 2 units; H&H of 10.6/32.5 on 12/05/10  . Arthritis   . Malnutrition    Past Surgical History  Procedure Laterality Date  . Total knee arthroplasty Bilateral   . Orif distal femur fracture Left 11/2010   Family History  Problem Relation Age of Onset  . Hypertension    . Diabetes    . Arthritis     History  Substance Use Topics  . Smoking status: Never Smoker   . Smokeless tobacco: Never Used  . Alcohol Use: No    Review of Systems ROS: Statement: All systems negative except as marked or noted in the HPI; Constitutional: Negative for fever and chills. +generalized fatigue/weakness.; ; Eyes: Negative for eye pain, redness and discharge. ; ; ENMT: Negative for ear  pain, hoarseness, nasal congestion, sinus pressure and sore throat. ; ; Cardiovascular: Negative for chest pain, palpitations, diaphoresis, dyspnea and peripheral edema. ; ; Respiratory: Negative for cough, wheezing and stridor. ; ; Gastrointestinal: +N/V/D, abd pain. Negative for blood in stool, hematemesis, jaundice and rectal bleeding. . ; ; Genitourinary: Negative for dysuria, flank pain and hematuria. ; ; Musculoskeletal: Negative for back pain and neck pain. Negative for swelling and trauma.; ; Skin: Negative for pruritus, rash, abrasions, blisters, bruising and skin lesion.; ; Neuro: Negative for headache, lightheadedness and neck stiffness. Negative for altered level of consciousness , altered mental status, extremity weakness, paresthesias, involuntary movement, seizure and syncope.      Allergies  Review of patient's allergies indicates no known allergies.  Home Medications   Prior to Admission medications   Medication Sig Start Date End Date Taking? Authorizing Provider  AZOR 10-40 MG per tablet Take 1 tablet by mouth daily. 02/20/14  Yes Historical Provider, MD  ciprofloxacin (CIPRO) 500 MG tablet Take 1 tablet (500 mg total) by mouth 2 (two) times daily. 03/20/14  Yes Rosita Fire, MD  diphenoxylate-atropine (LOMOTIL) 2.5-0.025 MG per tablet Take 1 tablet by mouth every 6 (six) hours as needed for diarrhea or loose stools.  01/04/14  Yes Historical Provider, MD  losartan (COZAAR) 50 MG tablet Take  1 tablet by mouth daily. 03/22/14  Yes Historical Provider, MD   BP 111/60 mmHg  Pulse 123  Temp(Src) 97.9 F (36.6 C) (Rectal)  Resp 40  Wt 115 lb (52.164 kg)  SpO2 97%    Filed Vitals:   03/29/2014 1430 03/29/2014 1501 04/07/2014 1530 04/12/2014 1700  BP: 108/85 151/69 136/62 137/98  Pulse:      Temp:      TempSrc:      Resp: 23 28  21   Weight:      SpO2:        Physical Exam  1330: Physical examination:  Nursing notes reviewed; Vital signs and O2 SAT reviewed;  Constitutional:  Cachectic, Uncomfortable appearing.; Head:  Normocephalic, atraumatic; Eyes: EOMI, PERRL, No scleral icterus; ENMT: Mouth and pharynx normal, Mucous membranes dry; Neck: Supple, Full range of motion, No lymphadenopathy; Cardiovascular: Tachycardic rate and rhythm, No gallop; Respiratory: Breath sounds clear & equal bilaterally, No wheezes.  Speaking full sentences with ease, Normal respiratory effort/excursion; Chest: Nontender, Movement normal; Abdomen: Soft, Nontender, Nondistended, Normal bowel sounds. Rectal exam performed w/permission of pt and ED RN chaperone present.  Anal tone normal.  Non-tender, soft brown stool in rectal vault, heme positive. Superficial skin breakdown to perianal area. No fissures, no external hemorrhoids, no palp masses.; Genitourinary: No CVA tenderness; Extremities: Pulses normal, No tenderness, No edema, No calf edema or asymmetry.; Neuro: AA&Ox3, Major CN grossly intact.  Speech clear. No gross focal motor or sensory deficits in extremities.; Skin: Color normal, Warm, Dry.   ED Course  Procedures    EKG Interpretation   Date/Time:  Sunday April 02 2014 13:28:37 EST  On arrival Ventricular Rate:  113 PR Interval:  121 QRS Duration: 127 QT Interval:  388 QTC Calculation: 532 R Axis:   97 Text Interpretation:  Sinus tachycardia Premature atrial complexes  Consider left ventricular hypertrophy Probable lateral infarct, age  indeterminate Anterior Q waves, possibly due to LVH Nonspecific ST and T  wave abnormality Diffuse leads Prolonged QT interval Baseline wander When  compared with ECG of 03/16/2014 Nonspecific ST and T wave abnormality is  more pronounced Confirmed by [facility]  MD, Kailea Dannemiller [phone]) on 03/27/2014  1:47:27 PM    EKG Interpretation  Date/Time:  Sunday April 02 2014 13:40:23 EST repeated d/t artifact Ventricular Rate:  114 PR Interval:  125 QRS Duration: 128 QT Interval:  425 QTC Calculation: 585 R Axis:   97 Text Interpretation:   Sinus tachycardia Consider left ventricular hypertrophy Anterolateral infarct, age indeterminate Probable Lateral infarct , age undetermined Prolonged QT interval Since last tracing of earlier today No significant change was found Confirmed by [facility]  MD, Nunzio Cory [phone]) on 04/23/2014 1:49:04 PM        MDM  MDM Reviewed: previous chart, nursing note and vitals Reviewed previous: labs and ECG Interpretation: ECG, x-ray and labs Total time providing critical care: 30-74 minutes. This excludes time spent performing separately reportable procedures and services. Consults: admitting MD   CRITICAL CARE Performed by: Alfonzo Feller Total critical care time: 35 Critical care time was exclusive of separately billable procedures and treating other patients. Critical care was necessary to treat or prevent imminent or life-threatening deterioration. Critical care was time spent personally by me on the following activities: development of treatment plan with patient and/or surrogate as well as nursing, discussions with consultants, evaluation of patient's response to treatment, examination of patient, obtaining history from patient or surrogate, ordering and performing treatments and interventions, ordering and review of laboratory studies,  ordering and review of radiographic studies, pulse oximetry and re-evaluation of patient's condition.   Results for orders placed or performed during the hospital encounter of 04/15/2014  CBC with Differential  Result Value Ref Range   WBC 10.5 4.0 - 10.5 K/uL   RBC 4.48 3.87 - 5.11 MIL/uL   Hemoglobin 12.7 12.0 - 15.0 g/dL   HCT 38.8 36.0 - 46.0 %   MCV 86.6 78.0 - 100.0 fL   MCH 28.3 26.0 - 34.0 pg   MCHC 32.7 30.0 - 36.0 g/dL   RDW 17.5 (H) 11.5 - 15.5 %   Platelets 250 150 - 400 K/uL   Neutrophils Relative % 82 (H) 43 - 77 %   Lymphocytes Relative 10 (L) 12 - 46 %   Monocytes Relative 8 3 - 12 %   Eosinophils Relative 0 0 - 5 %   Basophils  Relative 0 0 - 1 %   Neutro Abs 8.6 (H) 1.7 - 7.7 K/uL   Lymphs Abs 1.1 0.7 - 4.0 K/uL   Monocytes Absolute 0.8 0.1 - 1.0 K/uL   Eosinophils Absolute 0.0 0.0 - 0.7 K/uL   Basophils Absolute 0.0 0.0 - 0.1 K/uL   RBC Morphology SCHISTOCYTES PRESENT (2-5/hpf)    WBC Morphology ATYPICAL LYMPHOCYTES   Comprehensive metabolic panel  Result Value Ref Range   Sodium 142 135 - 145 mmol/L   Potassium 3.2 (L) 3.5 - 5.1 mmol/L   Chloride 111 96 - 112 mmol/L   CO2 10 (LL) 19 - 32 mmol/L   Glucose, Bld 106 (H) 70 - 99 mg/dL   BUN 35 (H) 6 - 23 mg/dL   Creatinine, Ser 2.63 (H) 0.50 - 1.10 mg/dL   Calcium 8.7 8.4 - 10.5 mg/dL   Total Protein 6.4 6.0 - 8.3 g/dL   Albumin 2.9 (L) 3.5 - 5.2 g/dL   AST 47 (H) 0 - 37 U/L   ALT 33 0 - 35 U/L   Alkaline Phosphatase 121 (H) 39 - 117 U/L   Total Bilirubin 1.5 (H) 0.3 - 1.2 mg/dL   GFR calc non Af Amer 15 (L) >90 mL/min   GFR calc Af Amer 18 (L) >90 mL/min   Anion gap NOT CALCULATED 5 - 15  Lipase, blood  Result Value Ref Range   Lipase 22 11 - 59 U/L  Troponin I  Result Value Ref Range   Troponin I 0.10 (H) <0.031 ng/mL  Lactic acid, plasma  Result Value Ref Range   Lactic Acid, Venous 6.8 (HH) 0.5 - 2.0 mmol/L  Lactic acid, plasma  Result Value Ref Range   Lactic Acid, Venous 3.9 (HH) 0.5 - 2.0 mmol/L  Urinalysis, Routine w reflex microscopic  Result Value Ref Range   Color, Urine YELLOW YELLOW   APPearance CLEAR CLEAR   Specific Gravity, Urine >1.030 (H) 1.005 - 1.030   pH 5.5 5.0 - 8.0   Glucose, UA NEGATIVE NEGATIVE mg/dL   Hgb urine dipstick NEGATIVE NEGATIVE   Bilirubin Urine MODERATE (A) NEGATIVE   Ketones, ur NEGATIVE NEGATIVE mg/dL   Protein, ur 30 (A) NEGATIVE mg/dL   Urobilinogen, UA 0.2 0.0 - 1.0 mg/dL   Nitrite NEGATIVE NEGATIVE   Leukocytes, UA NEGATIVE NEGATIVE  Magnesium  Result Value Ref Range   Magnesium 2.2 1.5 - 2.5 mg/dL  Urine microscopic-add on  Result Value Ref Range   Bacteria, UA FEW (A) RARE   Casts  HYALINE CASTS (A) NEGATIVE  Troponin I  Result Value Ref Range  Troponin I 0.08 (H) <0.031 ng/mL  POC occult blood, ED  Result Value Ref Range   Fecal Occult Bld POSITIVE (A) NEGATIVE   Ct Abdomen Pelvis Wo Contrast 04/16/2014   CLINICAL DATA:  Abdominal pain, hypotension  EXAM: CT ABDOMEN AND PELVIS WITHOUT CONTRAST  TECHNIQUE: Multidetector CT imaging of the abdomen and pelvis was performed following the standard protocol without IV contrast.  COMPARISON:  None.  FINDINGS: Lower chest:  Lung bases are clear.  Small hiatal hernia.  Hepatobiliary: The liver is replaced by poorly visualized round and oval masses, not well individually measure due to lack of contrast. Largest of these located within the anterior segment right hepatic lobe measures 4.9 x 4.2 cm image 24. Gallbladder is unremarkable. No intrahepatic ductal dilatation.  Pancreas: Atrophic, fatty replaced, poorly visualized in degraded by respiratory motion but no focal abnormality identified.  Spleen: Normal  Adrenals/Urinary Tract: Adrenal glands appear normal. Right upper renal pole 3.3 cm cyst noted. Right lower renal pole 1.4 cm cyst. Left kidney are well visualized due to motion artifact but no gross evidence for focal abnormality. No hydronephrosis on either side. No radiopaque renal or ureteral calculus.  Stomach/Bowel: No bowel wall thickening or focal segmental dilatation is identified. The appendix appears normal, image 53. Stomach is unremarkable. The rectum/anal verge is not well imaged and the remainder of the bowel is poorly visualized without contrast.  Vascular/Lymphatic: Retroperitoneal lymphadenopathy is identified with representative aortocaval node measuring 1.9 cm image 38. Gastrohepatic node measures 1.3 cm image 27. No pelvic sidewall or inguinal lymphadenopathy.  Other: Uterus and ovaries appear normal. No ascites or free air. No measurable omental mass. Dense atheromatous aortic calcification without aneurysm.   Musculoskeletal: Degenerative changes are noted in the spine. Left femoral hardware partly visualized. No compression fracture identified.  IMPRESSION: Liver replaced by innumerable oval and round masses with concomitant upper abdominal gastrohepatic and retroperitoneal lymphadenopathy. Metastatic disease from unknown primary or multi focal hepatocellular carcinoma could have this appearance. Tissue sampling is recommended for further characterization.  These results were called by telephone at the time of interpretation on 04/20/2014 at 4:24 pm to Dr. Francine Graven , who verbally acknowledged these results.   Electronically Signed   By: Conchita Paris M.D.   On: 04/11/2014 16:25   Dg Chest Port 1 View 04/23/2014   CLINICAL DATA:  Hypotension, abdominal pain  EXAM: PORTABLE CHEST - 1 VIEW  COMPARISON:  None.  FINDINGS: Hyperinflation suggests emphysema. Mild cardiomegaly noted. No evidence for edema. No focal pulmonary opacity. No pleural effusion. No acute osseous finding.  IMPRESSION: Cardiomegaly with hyperinflation suggesting emphysema but no focal acute finding.   Electronically Signed   By: Conchita Paris M.D.   On: 04/17/2014 15:29   Dg Abd Portable 2v 04/15/2014   CLINICAL DATA:  Abdominal pain and hypotension.  EXAM: PORTABLE ABDOMEN - 2 VIEW  COMPARISON:  03/31/2013 and 11/27/2010  FINDINGS: Bowel gas pattern is nonobstructive. There is no free peritoneal air. There are moderate degenerative changes of the spine a mild degenerative change of the hips. Lung bases are unremarkable.  IMPRESSION: Nonobstructive bowel gas pattern.   Electronically Signed   By: Marin Olp M.D.   On: 04/20/2014 15:31    1640:  Pt more comfortable after pain meds. HR trending downward and BP trending upward with judicious IVF. Troponin elevated, but pt denies CP and repeat is pending; will hold ASA due to heme positive stool. New ARF. Lactic acid elevated, repeat is pending; likely multifactorial (  starvation, multiple  liver masses, ARF). Will continue judicious IVF. Pt has not had N/V or stooling while in the ED. Dx and testing d/w pt (family not currently in exam room).  Questions answered.  Verb understanding, agreeable to admit. T/C to [facility] Dr. Anastasio Champion, case discussed, including:  HPI, pertinent PM/SHx, VS/PE, dx testing, ED course and treatment:  Agreeable to admit, requests to write temporary orders, obtain tele bed to Dr. Josephine Cables service.  1725:  Troponin and lactic acid are both trending downward. Will continue IVF.  [facility] MD aware.   Francine Graven, DO 2014-04-15 1956

## 2014-04-02 NOTE — ED Notes (Signed)
Repeat lactic acid critical result given to Dr Anastasio Champion.

## 2014-04-02 NOTE — ED Notes (Signed)
Patient c/o leg pain, Dr Thurnell Garbe notified and updated on Vital signs.

## 2014-04-02 NOTE — ED Notes (Signed)
CRITICAL VALUE ALERT  Critical value received:  Lactic Acid, CO2  Date of notification:  04/13/2014  Time of notification:  3358  Critical value read back:Yes.    Nurse who received alert:  Jeanice Lim, RN  MD notified (1st page):  Dr Thurnell Garbe  Time of first page:  [phone]

## 2014-04-02 NOTE — ED Notes (Addendum)
Patient arrives via EMS from home with c/o diarrhea, nausea, vomiting x 2 weeks. Patient with dry mucous membranes. C/o stomach pain and dark diarrhea. Patient alert. Hypotensive. EMS reports patient was not initially responsive upon their arrival to house, but that when in trendelenburg, pt became responsive.

## 2014-04-02 NOTE — H&P (Signed)
[facility] History and Physical  [name] BTD:[id number] DOB: 1925/09/30 DOA: 04/18/2014  Referring physician: ER PCP: Leslie Fire, MD   Chief Complaint: Diarrhea. Weight loss.  HPI: Leslie Pope is a 81 y.o. female  This is an 81 year old lady who was recently hospitalized in the third week of January with diarrhea and dysuria. She was treated for dehydration and UTI. She improved and she was discharged home. She returns again with diarrhea again. This time, her sons are present at the bedside and they tell me that she has lost 80-90 pounds in the last few months. She has a poor appetite. She has been feeling dizzy. She denies any abdominal pain this time. Evaluation in the emergency room with a CT abdominal scan shows it to have widespread metastatic liver disease. She is now being admitted for further management.   Review of Systems:  Apart from symptoms above, all systems negative.  Past Medical History  Diagnosis Date  . Hypertension     Lab: Normal BMet in 11/2010  . Chronic knee pain     status post bilateral TKA  . Deep vein thrombosis 11/2010    following left femoral fracture with closed reduction and immobilization in a knee brace  . Anemia 11/2010    post fracture; transfused 2 units; H&H of 10.6/32.5 on 12/05/10  . Arthritis   . Malnutrition    Past Surgical History  Procedure Laterality Date  . Total knee arthroplasty Bilateral   . Orif distal femur fracture Left 11/2010   Social History:  reports that she has never smoked. She has never used smokeless tobacco. She reports that she does not drink alcohol or use illicit drugs.  No Known Allergies  Family History  Problem Relation Age of Onset  . Hypertension    . Diabetes    . Arthritis        Prior to Admission medications   Medication Sig Start Date End Date Taking? Authorizing Provider  AZOR 10-40 MG per tablet Take 1 tablet by mouth daily. 02/20/14  Yes Historical Provider, MD    ciprofloxacin (CIPRO) 500 MG tablet Take 1 tablet (500 mg total) by mouth 2 (two) times daily. 03/20/14  Yes Leslie Fire, MD  diphenoxylate-atropine (LOMOTIL) 2.5-0.025 MG per tablet Take 1 tablet by mouth every 6 (six) hours as needed for diarrhea or loose stools.  01/04/14  Yes Historical Provider, MD  losartan (COZAAR) 50 MG tablet Take 1 tablet by mouth daily. 03/22/14  Yes Historical Provider, MD   Physical Exam: Filed Vitals:   04/13/2014 1430 04/23/2014 1501 03/31/2014 1530 04/10/2014 1700  BP: 108/85 151/69 136/62 137/98  Pulse:      Temp:      TempSrc:      Resp: 23 28  21   Weight:      SpO2:        Wt Readings from Last 3 Encounters:  03/30/2014 52.164 kg (115 lb)  03/16/14 55.157 kg (121 lb 9.6 oz)  06/19/11 80.74 kg (178 lb)    General:  Appears Clinically dehydrated. She is cachectic. Eyes: PERRL, normal lids, irises & conjunctiva ENT: grossly normal hearing, lips & tongue Neck: no LAD, masses or thyromegaly Cardiovascular: RRR, no m/r/g. No LE edema. Telemetry: SR, no arrhythmias  Respiratory: CTA bilaterally, no w/r/r. Normal respiratory effort. Abdomen: soft, ntnd Skin: no rash or induration seen on limited exam Musculoskeletal: grossly normal tone BUE/BLE Psychiatric: grossly normal mood and affect, speech fluent and appropriate Neurologic: grossly non-focal.  Labs on Admission:  Basic Metabolic Panel:  Recent Labs Lab 03/28/2014 1405  NA 142  K 3.2*  CL 111  CO2 10*  GLUCOSE 106*  BUN 35*  CREATININE 2.63*  CALCIUM 8.7  MG 2.2   Liver Function Tests:  Recent Labs Lab 03/30/2014 1405  AST 47*  ALT 33  ALKPHOS 121*  BILITOT 1.5*  PROT 6.4  ALBUMIN 2.9*    Recent Labs Lab 04/04/2014 1405  LIPASE 22   No results for input(s): AMMONIA in the last 168 hours. CBC:  Recent Labs Lab 04/10/2014 1405  WBC 10.5  NEUTROABS 8.6*  HGB 12.7  HCT 38.8  MCV 86.6  PLT 250   Cardiac Enzymes:  Recent Labs Lab 04/09/2014 1405  TROPONINI 0.10*     BNP (last 3 results) No results for input(s): BNP in the last 8760 hours.  ProBNP (last 3 results) No results for input(s): PROBNP in the last 8760 hours.  CBG: No results for input(s): GLUCAP in the last 168 hours.  Radiological Exams on Admission: Ct Abdomen Pelvis Wo Contrast  04/20/2014   CLINICAL DATA:  Abdominal pain, hypotension  EXAM: CT ABDOMEN AND PELVIS WITHOUT CONTRAST  TECHNIQUE: Multidetector CT imaging of the abdomen and pelvis was performed following the standard protocol without IV contrast.  COMPARISON:  None.  FINDINGS: Lower chest:  Lung bases are clear.  Small hiatal hernia.  Hepatobiliary: The liver is replaced by poorly visualized round and oval masses, not well individually measure due to lack of contrast. Largest of these located within the anterior segment right hepatic lobe measures 4.9 x 4.2 cm image 24. Gallbladder is unremarkable. No intrahepatic ductal dilatation.  Pancreas: Atrophic, fatty replaced, poorly visualized in degraded by respiratory motion but no focal abnormality identified.  Spleen: Normal  Adrenals/Urinary Tract: Adrenal glands appear normal. Right upper renal pole 3.3 cm cyst noted. Right lower renal pole 1.4 cm cyst. Left kidney are well visualized due to motion artifact but no gross evidence for focal abnormality. No hydronephrosis on either side. No radiopaque renal or ureteral calculus.  Stomach/Bowel: No bowel wall thickening or focal segmental dilatation is identified. The appendix appears normal, image 53. Stomach is unremarkable. The rectum/anal verge is not well imaged and the remainder of the bowel is poorly visualized without contrast.  Vascular/Lymphatic: Retroperitoneal lymphadenopathy is identified with representative aortocaval node measuring 1.9 cm image 38. Gastrohepatic node measures 1.3 cm image 27. No pelvic sidewall or inguinal lymphadenopathy.  Other: Uterus and ovaries appear normal. No ascites or free air. No measurable omental  mass. Dense atheromatous aortic calcification without aneurysm.  Musculoskeletal: Degenerative changes are noted in the spine. Left femoral hardware partly visualized. No compression fracture identified.  IMPRESSION: Liver replaced by innumerable oval and round masses with concomitant upper abdominal gastrohepatic and retroperitoneal lymphadenopathy. Metastatic disease from unknown primary or multi focal hepatocellular carcinoma could have this appearance. Tissue sampling is recommended for further characterization.  These results were called by telephone at the time of interpretation on 04/04/2014 at 4:24 pm to Dr. Francine Graven , who verbally acknowledged these results.   Electronically Signed   By: Conchita Paris M.D.   On: 04/11/2014 16:25   Dg Chest Port 1 View  04/13/2014   CLINICAL DATA:  Hypotension, abdominal pain  EXAM: PORTABLE CHEST - 1 VIEW  COMPARISON:  None.  FINDINGS: Hyperinflation suggests emphysema. Mild cardiomegaly noted. No evidence for edema. No focal pulmonary opacity. No pleural effusion. No acute osseous finding.  IMPRESSION: Cardiomegaly with  hyperinflation suggesting emphysema but no focal acute finding.   Electronically Signed   By: Conchita Paris M.D.   On: 03/29/2014 15:29   Dg Abd Portable 2v  04/12/2014   CLINICAL DATA:  Abdominal pain and hypotension.  EXAM: PORTABLE ABDOMEN - 2 VIEW  COMPARISON:  03/31/2013 and 11/27/2010  FINDINGS: Bowel gas pattern is nonobstructive. There is no free peritoneal air. There are moderate degenerative changes of the spine a mild degenerative change of the hips. Lung bases are unremarkable.  IMPRESSION: Nonobstructive bowel gas pattern.   Electronically Signed   By: Marin Olp M.D.   On: 04/16/2014 15:31     Assessment/Plan   1. Metastatic liver disease. The primary is not clear. However I'm not sure it changes management. The sons at this present time do not wish further investigation to be done. I have not discussed this further  with the patient herself for the time being. She may need liver biopsy. 2. Dehydration. This will be treated with IV fluids. 3. Hypokalemia. This will be repleted.   Further recommendations will depend on patient's hospital progress.  Code Status :DO NOT RESUSCITATE. This was confirmed by the patient's sons  DVT Prophylaxis:Lovenox  Family Communication: I discussed with patient and sons at bedside.   Disposition Plan: Depending on progress.  Time spent: 60 minutes  [facility] Pager [phone]

## 2014-04-02 NOTE — ED Notes (Signed)
Report given to [facility] unit 300. Ready to receive patient.

## 2014-04-02 NOTE — ED Notes (Signed)
Patient c/o abdominal pain. Appears very uncomfortable.

## 2014-04-03 ENCOUNTER — Encounter (HOSPITAL_COMMUNITY): Payer: Self-pay | Admitting: *Deleted

## 2014-04-03 LAB — COMPREHENSIVE METABOLIC PANEL
ALK PHOS: 105 U/L (ref 39–117)
ALT: 44 U/L — ABNORMAL HIGH (ref 0–35)
AST: 76 U/L — AB (ref 0–37)
Albumin: 2.8 g/dL — ABNORMAL LOW (ref 3.5–5.2)
Anion gap: 9 (ref 5–15)
BILIRUBIN TOTAL: 1.1 mg/dL (ref 0.3–1.2)
BUN: 41 mg/dL — ABNORMAL HIGH (ref 6–23)
CHLORIDE: 119 mmol/L — AB (ref 96–112)
CO2: 15 mmol/L — ABNORMAL LOW (ref 19–32)
CREATININE: 2.33 mg/dL — AB (ref 0.50–1.10)
Calcium: 7.6 mg/dL — ABNORMAL LOW (ref 8.4–10.5)
GFR calc non Af Amer: 18 mL/min — ABNORMAL LOW (ref >90)
GFR, EST AFRICAN AMERICAN: 20 mL/min — AB (ref >90)
Glucose, Bld: 101 mg/dL — ABNORMAL HIGH (ref 70–99)
POTASSIUM: 3.5 mmol/L (ref 3.5–5.1)
SODIUM: 143 mmol/L (ref 135–145)
Total Protein: 5.9 g/dL — ABNORMAL LOW (ref 6.0–8.3)

## 2014-04-03 LAB — CBC
HCT: 36.8 % (ref 36.0–46.0)
Hemoglobin: 12.1 g/dL (ref 12.0–15.0)
MCH: 27.9 pg (ref 26.0–34.0)
MCHC: 32.9 g/dL (ref 30.0–36.0)
MCV: 85 fL (ref 78.0–100.0)
Platelets: 208 10*3/uL (ref 150–400)
RBC: 4.33 MIL/uL (ref 3.87–5.11)
RDW: 17.7 % — ABNORMAL HIGH (ref 11.5–15.5)
WBC: 17.7 10*3/uL — AB (ref 4.0–10.5)

## 2014-04-03 LAB — TROPONIN I
TROPONIN I: 0.12 ng/mL — AB (ref <0.031)
Troponin I: 0.12 ng/mL — ABNORMAL HIGH (ref <0.031)

## 2014-04-03 LAB — PROTIME-INR
INR: 1.4 (ref 0.00–1.49)
Prothrombin Time: 17.3 seconds — ABNORMAL HIGH (ref 11.6–15.2)

## 2014-04-03 MED ORDER — POTASSIUM CHLORIDE 10 MEQ/100ML IV SOLN
10.0000 meq | INTRAVENOUS | Status: AC
  Administered 2014-04-03 (×3): 10 meq via INTRAVENOUS
  Filled 2014-04-03 (×2): qty 100

## 2014-04-03 MED ORDER — SODIUM BICARBONATE 8.4 % IV SOLN
INTRAVENOUS | Status: DC
  Administered 2014-04-03 – 2014-04-04 (×2): via INTRAVENOUS
  Filled 2014-04-03 (×3): qty 100

## 2014-04-03 MED ORDER — PROMETHAZINE HCL 25 MG/ML IJ SOLN
6.2500 mg | INTRAMUSCULAR | Status: DC | PRN
Start: 2014-04-03 — End: 2014-04-05
  Administered 2014-04-03 – 2014-04-04 (×2): 6.25 mg via INTRAVENOUS
  Filled 2014-04-03 (×2): qty 1

## 2014-04-03 NOTE — Progress Notes (Signed)
UR chart review completed.  

## 2014-04-03 NOTE — Progress Notes (Signed)
Subjective: Patient was admitted yesterday due to recurrent diarrhea and genrelized weakness. She continued to  Loose weight. Her Ct scan of the abdomen showed metastatic liver disease.  Objective: Vital signs in last 24 hours: Temp:  [97.6 F (36.4 C)-98 F (36.7 C)] 97.6 F (36.4 C) (02/08 0443) Pulse Rate:  [84-123] 91 (02/08 0443) Resp:  [20-40] 20 (02/08 0443) BP: (108-151)/(55-98) 135/55 mmHg (02/08 0443) SpO2:  [97 %-100 %] 100 % (02/08 0443) Weight:  [52.164 kg (115 lb)-53.1 kg (117 lb 1 oz)] 53.1 kg (117 lb 1 oz) (02/07 1901) Weight change:     Intake/Output from previous day:    PHYSICAL EXAM General appearance: fatigued, slowed mentation and fragile Resp: diminished breath sounds bilaterally and rhonchi bilaterally Cardio: S1, S2 normal GI: soft, non-tender; bowel sounds normal; no masses,  no organomegaly Extremities: extremities normal, atraumatic, no cyanosis or edema  Lab Results:  Results for orders placed or performed during the hospital encounter of 04/21/2014 (from the past 48 hour(s))  POC occult blood, ED     Status: Abnormal   Collection Time: 04/23/2014  1:43 PM  Result Value Ref Range   Fecal Occult Bld POSITIVE (A) NEGATIVE  Urinalysis, Routine w reflex microscopic     Status: Abnormal   Collection Time: 04/21/2014  2:00 PM  Result Value Ref Range   Color, Urine YELLOW YELLOW   APPearance CLEAR CLEAR   Specific Gravity, Urine >1.030 (H) 1.005 - 1.030   pH 5.5 5.0 - 8.0   Glucose, UA NEGATIVE NEGATIVE mg/dL   Hgb urine dipstick NEGATIVE NEGATIVE   Bilirubin Urine MODERATE (A) NEGATIVE   Ketones, ur NEGATIVE NEGATIVE mg/dL   Protein, ur 30 (A) NEGATIVE mg/dL   Urobilinogen, UA 0.2 0.0 - 1.0 mg/dL   Nitrite NEGATIVE NEGATIVE   Leukocytes, UA NEGATIVE NEGATIVE  Urine microscopic-add on     Status: Abnormal   Collection Time: 03/29/2014  2:00 PM  Result Value Ref Range   Bacteria, UA FEW (A) RARE   Casts HYALINE CASTS (A) NEGATIVE  CBC with  Differential     Status: Abnormal   Collection Time: 04/09/2014  2:05 PM  Result Value Ref Range   WBC 10.5 4.0 - 10.5 K/uL   RBC 4.48 3.87 - 5.11 MIL/uL   Hemoglobin 12.7 12.0 - 15.0 g/dL   HCT 38.8 36.0 - 46.0 %   MCV 86.6 78.0 - 100.0 fL   MCH 28.3 26.0 - 34.0 pg   MCHC 32.7 30.0 - 36.0 g/dL   RDW 17.5 (H) 11.5 - 15.5 %   Platelets 250 150 - 400 K/uL   Neutrophils Relative % 82 (H) 43 - 77 %   Lymphocytes Relative 10 (L) 12 - 46 %   Monocytes Relative 8 3 - 12 %   Eosinophils Relative 0 0 - 5 %   Basophils Relative 0 0 - 1 %   Neutro Abs 8.6 (H) 1.7 - 7.7 K/uL   Lymphs Abs 1.1 0.7 - 4.0 K/uL   Monocytes Absolute 0.8 0.1 - 1.0 K/uL   Eosinophils Absolute 0.0 0.0 - 0.7 K/uL   Basophils Absolute 0.0 0.0 - 0.1 K/uL   RBC Morphology SCHISTOCYTES PRESENT (2-5/hpf)     Comment: ACANTHOCYTES   WBC Morphology ATYPICAL LYMPHOCYTES   Comprehensive metabolic panel     Status: Abnormal   Collection Time: 04/21/2014  2:05 PM  Result Value Ref Range   Sodium 142 135 - 145 mmol/L   Potassium 3.2 (L) 3.5 - 5.1  mmol/L   Chloride 111 96 - 112 mmol/L   CO2 10 (LL) 19 - 32 mmol/L    Comment: CRITICAL RESULT CALLED TO, READ BACK BY AND VERIFIED WITH: OSBORNE,T AT 3:00PM ON 04/01/2014 BY FESTERMAN,C    Glucose, Bld 106 (H) 70 - 99 mg/dL   BUN 35 (H) 6 - 23 mg/dL   Creatinine, Ser 2.63 (H) 0.50 - 1.10 mg/dL   Calcium 8.7 8.4 - 10.5 mg/dL   Total Protein 6.4 6.0 - 8.3 g/dL   Albumin 2.9 (L) 3.5 - 5.2 g/dL   AST 47 (H) 0 - 37 U/L   ALT 33 0 - 35 U/L   Alkaline Phosphatase 121 (H) 39 - 117 U/L   Total Bilirubin 1.5 (H) 0.3 - 1.2 mg/dL   GFR calc non Af Amer 15 (L) >90 mL/min   GFR calc Af Amer 18 (L) >90 mL/min    Comment: (NOTE) The eGFR has been calculated using the CKD EPI equation. This calculation has not been validated in all clinical situations. eGFR's persistently <90 mL/min signify possible Chronic Kidney Disease.    Anion gap NOT CALCULATED 5 - 15  Lipase, blood     Status: None    Collection Time: 04/04/2014  2:05 PM  Result Value Ref Range   Lipase 22 11 - 59 U/L  Troponin I     Status: Abnormal   Collection Time: 03/30/2014  2:05 PM  Result Value Ref Range   Troponin I 0.10 (H) <0.031 ng/mL    Comment:        PERSISTENTLY INCREASED TROPONIN VALUES IN THE RANGE OF 0.04-0.49 ng/mL CAN BE SEEN IN:       -UNSTABLE ANGINA       -CONGESTIVE HEART FAILURE       -MYOCARDITIS       -CHEST TRAUMA       -ARRYHTHMIAS       -LATE PRESENTING MYOCARDIAL INFARCTION       -COPD   CLINICAL FOLLOW-UP RECOMMENDED.   Lactic acid, plasma     Status: Abnormal   Collection Time: 04/03/2014  2:05 PM  Result Value Ref Range   Lactic Acid, Venous 6.8 (HH) 0.5 - 2.0 mmol/L    Comment: CRITICAL RESULT CALLED TO, READ BACK BY AND VERIFIED WITH: OSBORNE,T AT 3:00PM ON 04/20/2014 BY FESTERMAN,C   Magnesium     Status: None   Collection Time: 04/20/2014  2:05 PM  Result Value Ref Range   Magnesium 2.2 1.5 - 2.5 mg/dL  Lactic acid, plasma     Status: Abnormal   Collection Time: 04/04/2014  4:42 PM  Result Value Ref Range   Lactic Acid, Venous 3.9 (HH) 0.5 - 2.0 mmol/L    Comment: CRITICAL RESULT CALLED TO, READ BACK BY AND VERIFIED WITH: ANDERSON,D AT 5:20PM ON 04/11/2014 BY FESTERMAN,C   Troponin I     Status: Abnormal   Collection Time: 04/21/2014  4:42 PM  Result Value Ref Range   Troponin I 0.08 (H) <0.031 ng/mL    Comment:        PERSISTENTLY INCREASED TROPONIN VALUES IN THE RANGE OF 0.04-0.49 ng/mL CAN BE SEEN IN:       -UNSTABLE ANGINA       -CONGESTIVE HEART FAILURE       -MYOCARDITIS       -CHEST TRAUMA       -ARRYHTHMIAS       -LATE PRESENTING MYOCARDIAL INFARCTION       -COPD   CLINICAL  FOLLOW-UP RECOMMENDED.   Troponin I     Status: Abnormal   Collection Time: 04/03/14 12:26 AM  Result Value Ref Range   Troponin I 0.12 (H) <0.031 ng/mL    Comment:        PERSISTENTLY INCREASED TROPONIN VALUES IN THE RANGE OF 0.04-0.49 ng/mL CAN BE SEEN IN:       -UNSTABLE ANGINA        -CONGESTIVE HEART FAILURE       -MYOCARDITIS       -CHEST TRAUMA       -ARRYHTHMIAS       -LATE PRESENTING MYOCARDIAL INFARCTION       -COPD   CLINICAL FOLLOW-UP RECOMMENDED.     ABGS No results for input(s): PHART, PO2ART, TCO2, HCO3 in the last 72 hours.  Invalid input(s): PCO2 CULTURES No results found for this or any previous visit (from the past 240 hour(s)). Studies/Results: Ct Abdomen Pelvis Wo Contrast  03/27/2014   CLINICAL DATA:  Abdominal pain, hypotension  EXAM: CT ABDOMEN AND PELVIS WITHOUT CONTRAST  TECHNIQUE: Multidetector CT imaging of the abdomen and pelvis was performed following the standard protocol without IV contrast.  COMPARISON:  None.  FINDINGS: Lower chest:  Lung bases are clear.  Small hiatal hernia.  Hepatobiliary: The liver is replaced by poorly visualized round and oval masses, not well individually measure due to lack of contrast. Largest of these located within the anterior segment right hepatic lobe measures 4.9 x 4.2 cm image 24. Gallbladder is unremarkable. No intrahepatic ductal dilatation.  Pancreas: Atrophic, fatty replaced, poorly visualized in degraded by respiratory motion but no focal abnormality identified.  Spleen: Normal  Adrenals/Urinary Tract: Adrenal glands appear normal. Right upper renal pole 3.3 cm cyst noted. Right lower renal pole 1.4 cm cyst. Left kidney are well visualized due to motion artifact but no gross evidence for focal abnormality. No hydronephrosis on either side. No radiopaque renal or ureteral calculus.  Stomach/Bowel: No bowel wall thickening or focal segmental dilatation is identified. The appendix appears normal, image 53. Stomach is unremarkable. The rectum/anal verge is not well imaged and the remainder of the bowel is poorly visualized without contrast.  Vascular/Lymphatic: Retroperitoneal lymphadenopathy is identified with representative aortocaval node measuring 1.9 cm image 38. Gastrohepatic node measures 1.3 cm image 27.  No pelvic sidewall or inguinal lymphadenopathy.  Other: Uterus and ovaries appear normal. No ascites or free air. No measurable omental mass. Dense atheromatous aortic calcification without aneurysm.  Musculoskeletal: Degenerative changes are noted in the spine. Left femoral hardware partly visualized. No compression fracture identified.  IMPRESSION: Liver replaced by innumerable oval and round masses with concomitant upper abdominal gastrohepatic and retroperitoneal lymphadenopathy. Metastatic disease from unknown primary or multi focal hepatocellular carcinoma could have this appearance. Tissue sampling is recommended for further characterization.  These results were called by telephone at the time of interpretation on 04/22/2014 at 4:24 pm to Dr. Francine Graven , who verbally acknowledged these results.   Electronically Signed   By: Conchita Paris M.D.   On: 04/01/2014 16:25   Dg Chest Port 1 View  04/15/2014   CLINICAL DATA:  Hypotension, abdominal pain  EXAM: PORTABLE CHEST - 1 VIEW  COMPARISON:  None.  FINDINGS: Hyperinflation suggests emphysema. Mild cardiomegaly noted. No evidence for edema. No focal pulmonary opacity. No pleural effusion. No acute osseous finding.  IMPRESSION: Cardiomegaly with hyperinflation suggesting emphysema but no focal acute finding.   Electronically Signed   By: Conchita Paris M.D.   On: 04/20/2014  15:29   Dg Abd Portable 2v  04/04/2014   CLINICAL DATA:  Abdominal pain and hypotension.  EXAM: PORTABLE ABDOMEN - 2 VIEW  COMPARISON:  03/31/2013 and 11/27/2010  FINDINGS: Bowel gas pattern is nonobstructive. There is no free peritoneal air. There are moderate degenerative changes of the spine a mild degenerative change of the hips. Lung bases are unremarkable.  IMPRESSION: Nonobstructive bowel gas pattern.   Electronically Signed   By: Marin Olp M.D.   On: 04/20/2014 15:31    Medications: I have reviewed the patient's current medications.  Assesment:   Active Problems:    Hypokalemia   Diarrhea   Dehydration   Metastatic cancer to liver    Plan: Medications reviewed Will start on bicarb drip Will meet with the family and discuss about her condition and further treatment plan GI consult Will monitor BMp    LOS: 1 day   Gaby Harney 04/03/2014, 8:32 AM

## 2014-04-03 NOTE — Progress Notes (Signed)
Consult received by Dr. Legrand Rams. Discussed with Dr. Legrand Rams, who states to hold off on consult until tomorrow, 2/9, as it may not be necessary. Patient with multiple liver masses concerning for metastatic disease of unknown primary or [place], and a family meeting is being arranged to discuss further work-up.  Orvil Feil, ANP-BC [facility]

## 2014-04-04 LAB — BASIC METABOLIC PANEL
Anion gap: 12 (ref 5–15)
BUN: 51 mg/dL — AB (ref 6–23)
CO2: 21 mmol/L (ref 19–32)
CREATININE: 3.07 mg/dL — AB (ref 0.50–1.10)
Calcium: 7.6 mg/dL — ABNORMAL LOW (ref 8.4–10.5)
Chloride: 111 mmol/L (ref 96–112)
GFR calc non Af Amer: 13 mL/min — ABNORMAL LOW (ref >90)
GFR, EST AFRICAN AMERICAN: 15 mL/min — AB (ref >90)
GLUCOSE: 147 mg/dL — AB (ref 70–99)
POTASSIUM: 3.9 mmol/L (ref 3.5–5.1)
Sodium: 144 mmol/L (ref 135–145)

## 2014-04-04 LAB — URINE CULTURE
CULTURE: NO GROWTH
Colony Count: NO GROWTH

## 2014-04-04 MED ORDER — DEXTROSE-NACL 5-0.9 % IV SOLN
INTRAVENOUS | Status: DC
  Administered 2014-04-04 – 2014-04-05 (×2): via INTRAVENOUS

## 2014-04-04 MED ORDER — LORAZEPAM 2 MG/ML IJ SOLN
0.5000 mg | INTRAMUSCULAR | Status: DC | PRN
  Administered 2014-04-04: 0.5 mg via INTRAVENOUS
  Filled 2014-04-04: qty 1

## 2014-04-04 MED ORDER — ENSURE COMPLETE PO LIQD
237.0000 mL | Freq: Two times a day (BID) | ORAL | Status: DC
  Administered 2014-04-04: 237 mL via ORAL

## 2014-04-04 NOTE — Progress Notes (Signed)
Family at bedside. Respirations shallow at times. Feet cool to touch. Patient not able to eat supper. Turn and position.

## 2014-04-04 NOTE — Progress Notes (Signed)
Reviewed notes from this morning. No further work-up per family. GI consult discontinued.  Orvil Feil, ANP-BC [facility]

## 2014-04-04 NOTE — Clinical Documentation Improvement (Signed)
MD's, NP's, and PA's  Per notes patient with "80-90 lb weight loss in last few months" BMI 19.48.  Please document clinical condition pertaining to weight status if appropriate.  Thank you    Possible Clinical conditions  Cachectic  Underweight  Other condition  Cannot clinically determine     Risk Factors: Malignant Liver Cancer, age   Diagnostic: Nutritional Consult  Treatment: Monitor   Thank You, Ree Kida ,RN Clinical Documentation Specialist:  [place] [facility]

## 2014-04-04 NOTE — Plan of Care (Signed)
Problem: Phase I Progression Outcomes Goal: Initial discharge plan identified Outcome: Progressing Patient to be discharge to [facility] when bed available.

## 2014-04-04 NOTE — Progress Notes (Signed)
Made on-call MD aware of EKG results, no new orders given. Will pass on in report to RN to let Dr. Legrand Rams know about EKG results this morning.

## 2014-04-04 NOTE — Clinical Social Work Note (Signed)
Pt is appropriate for GIP per hospice. CSW notified MD for D/C summary and H&P.   Benay Pike, [place]

## 2014-04-04 NOTE — Progress Notes (Addendum)
Family present at bedside, came out and reported that patient trembling. Took patients VS and they were about the same. Paged Dr.Fanta, following orders given, will continue to monitor patient.

## 2014-04-04 NOTE — Clinical Social Work Psychosocial (Signed)
Clinical Social Work Department BRIEF PSYCHOSOCIAL ASSESSMENT 04/04/2014  Patient:  DEYONA, SOZA     Account Number:  [id number]     Admit date:  04/04/2014  Clinical Social Worker:  Wyatt Haste  Date/Time:  04/04/2014 01:22 PM  Referred by:  Physician  Date Referred:  04/04/2014 Referred for  SNF Placement   Other Referral:   Interview type:  Family Other interview type:   son- Lonell    PSYCHOSOCIAL DATA Living Status:  FAMILY Admitted from facility:   Level of care:   Primary support name:  Lonell/Donell Primary support relationship to patient:  CHILD, ADULT Degree of support available:   supportive    CURRENT CONCERNS Current Concerns  Post-Acute Placement   Other Concerns:    SOCIAL WORK ASSESSMENT / PLAN CSW spoke with pt's son, Lonell as pt is unable to complete assessment per RN. Lonell states that pt lives with his brother, Donell. Pt came to ED due to peristant diarrhea. Pt had lost considerable weight over the past few months and family was very concerned. He reports that they knew she was sick, but pt had never told family that she had been diagnosed with cancer. They believe she was told last fall. Lonell feels this is all happening so fast since he was just made aware. CSW provided support. Pt has required more assist the last few weeks and someone has had to be with her all the time. Due to pt's decline, family does not feel that they can manage her care at home anymore. Lonell reports that they discussed options with PCP last night and have decided on Hospice Home. The family has some past involvement with hospice and are aware of services. MD put in order and referral sent. No Hospice Home beds available. Hospice will evaluate for possible GIP.   Assessment/plan status:  Referral to [employer] Other assessment/ plan:   Information/referral to community resources:   Hospice Home    PATIENT'S/FAMILY'S RESPONSE TO PLAN OF CARE: Pt unable to  participate in assessment due to confusion. Family is requesting no aggressive measures and would like hospice consult with transfer to [place]. CSW will follow up after hospice consult.       Benay Pike, [place]

## 2014-04-04 NOTE — Plan of Care (Signed)
Problem: Phase I Progression Outcomes Goal: OOB as tolerated unless otherwise ordered Outcome: Not Progressing Patient weak attempt to get patient up to chair.

## 2014-04-04 NOTE — Progress Notes (Signed)
Patient po intake poor. Family at bedside.

## 2014-04-04 NOTE — Plan of Care (Signed)
Problem: Phase I Progression Outcomes Goal: Voiding-avoid urinary catheter unless indicated Outcome: Not Applicable Date Met:  58/00/63 Patient has foley catheter for comfort/

## 2014-04-04 NOTE — Progress Notes (Signed)
Informed Dr. Legrand Rams of telemetry reading and EKG obtained earlier.

## 2014-04-04 NOTE — Progress Notes (Signed)
INITIAL NUTRITION ASSESSMENT Pt meets criteria for Severe MALNUTRITION in the context of Chronic as evidenced by wt loss of  >7.5% bw in 3 months and energy intake < 75% of estimated needs for > 1 month. DOCUMENTATION CODES Per approved criteria  -Severe malnutrition in the context of chronic illness   INTERVENTION: -Ordered ensure BID to provide additional 350 kcals and 13g Pro per container  -Encouraged PO intake to best of ability   NUTRITION DIAGNOSIS: Inadequate oral intake related to GI distress, nausea, vomiting, and diarrhea as evidenced by losing around 70 pounds in the past few months.   Goal: Pt to meet >/= 90% of their estimated nutrition needs   Monitor:  Goals of care, oral intake, weight, labs, GI distress  Reason for Assessment: MST of 5  81 y.o. female  Admitting Dx: <principal problem not specified>  ASSESSMENT:  81 year old lady who was recently hospitalized in the third week of January with diarrhea and dysuria. She returns again with diarrhea again. Per sons, she has lost 80-90 pounds in the last few months, and has a poor appetite/dizziness Evaluation pt to have widespread metastatic liver disease. She is now being admitted for further management  Spoke with pt who was in visible distress. She says her wt loss is because she has had absolutely no appetite and has had severe GI distress (n/v/d). She confirmed that she has lost 70 pounds in the last few months. She said she was in bad pain at this time-did not conduct physical assessment. Was agreeable to try ensure.   Pt is at high nutritional risk due to age, advanced cancer status, and severe symptoms.   Nutrition Focused Physical Exam: Per visual assessment. Did not touch patient d/t patient being in pain  Subcutaneous Fat:  Orbital Region:N/A Upper Arm Region: N/A Thoracic and Lumbar Region: n/a  Muscle:  Temple Region: N/A Clavicle Bone Region: Severe Clavicle and Acromion Bone Region:  Severe Scapular Bone Region: N/A Dorsal Hand: N/A Patellar Region: N/A Anterior Thigh Region: N/A Posterior Calf Region: N/A  Edema: n/a   Height: Ht Readings from Last 1 Encounters:  04/04/2014 5\' 5"  (1.651 m)    Weight: Wt Readings from Last 1 Encounters:  04/18/2014 117 lb 1 oz (53.1 kg)    Ideal Body Weight: 125  % Ideal Body Weight: 94%  Wt Readings from Last 10 Encounters:  04/16/2014 117 lb 1 oz (53.1 kg)  03/16/14 121 lb 9.6 oz (55.157 kg)  06/19/11 178 lb (80.74 kg)  03/20/11 175 lb (79.379 kg)    Usual Body Weight: ~180  % Usual Body Weight: 65%  BMI:  Body mass index is 19.48 kg/(m^2).  Estimated Nutritional Needs: Kcal: 1850-2100 (35-40 kcals/kg) Protein: 80-96 grams (1.5-1.7g/kg) Fluid: 1850-2100 plus replacement of all fluids lost to diarrhea  Skin: WDL  Diet Order: Diet regular  EDUCATION NEEDS: -Education not appropriate at this time   Intake/Output Summary (Last 24 hours) at 04/04/14 0846 Last data filed at 04/04/14 0548  Gross per 24 hour  Intake   2220 ml  Output   1300 ml  Net    920 ml    Last BM: Unknown   Labs:   Recent Labs Lab 03/27/2014 1405 04/03/14 0853 04/04/14 0642  NA 142 143 144  K 3.2* 3.5 3.9  CL 111 119* 111  CO2 10* 15* 21  BUN 35* 41* 51*  CREATININE 2.63* 2.33* 3.07*  CALCIUM 8.7 7.6* 7.6*  MG 2.2  --   --  GLUCOSE 106* 101* 147*    CBG (last 3)  No results for input(s): GLUCAP in the last 72 hours.  Scheduled Meds: . enoxaparin (LOVENOX) injection  30 mg Subcutaneous Q24H  . sodium chloride  3 mL Intravenous Q12H    Continuous Infusions: . dextrose 5 % and 0.9% NaCl 125 mL/hr at 04/04/14 0131    Past Medical History  Diagnosis Date  . Hypertension     Lab: Normal BMet in 11/2010  . Chronic knee pain     status post bilateral TKA  . Deep vein thrombosis 11/2010    following left femoral fracture with closed reduction and immobilization in a knee brace  . Anemia 11/2010    post fracture;  transfused 2 units; H&H of 10.6/32.5 on 12/05/10  . Arthritis   . Malnutrition     Past Surgical History  Procedure Laterality Date  . Total knee arthroplasty Bilateral   . Orif distal femur fracture Left 11/2010    Burtis Junes RD, LDN Nutrition Pager: [phone] 04/04/2014 8:46 AM

## 2014-04-04 NOTE — Progress Notes (Signed)
Patient has a irregular heart rate and rate is as high as 130, ordering EKG, will page on-call MD with results and continue to monitor the patient.

## 2014-04-04 NOTE — Progress Notes (Signed)
Subjective: Patient is awake but chronically sick looking and very weak. Her po intake is very poor. I talked to her sons yesterday and discussed  Her condition and her prognosis. They don't want any aggressive measures. They want her to be stabilized and transferred to nursing home for comfort measures. They declined to have hospice service.   Objective: Vital signs in last 24 hours: Temp:  [97.5 F (36.4 C)-97.7 F (36.5 C)] 97.7 F (36.5 C) (02/09 0500) Pulse Rate:  [113-118] 113 (02/09 0500) Resp:  [20-22] 22 (02/09 0500) BP: (115-127)/(50-52) 115/50 mmHg (02/09 0500) SpO2:  [100 %] 100 % (02/09 0500) Weight change:     Intake/Output from previous day: 02/08 0701 - 02/09 0700 In: 2220 [P.O.:240; I.V.:1980] Out: 1300 [Urine:1300]  PHYSICAL EXAM General appearance: fatigued, slowed mentation and fragile Resp: diminished breath sounds bilaterally and rhonchi bilaterally Cardio: tachycardiac GI: soft, non-tender; bowel sounds normal; no masses,  no organomegaly Extremities: extremities normal, atraumatic, no cyanosis or edema  Lab Results:  Results for orders placed or performed during the hospital encounter of 04/21/2014 (from the past 48 hour(s))  POC occult blood, ED     Status: Abnormal   Collection Time: 03/28/2014  1:43 PM  Result Value Ref Range   Fecal Occult Bld POSITIVE (A) NEGATIVE  Urinalysis, Routine w reflex microscopic     Status: Abnormal   Collection Time: 04/09/2014  2:00 PM  Result Value Ref Range   Color, Urine YELLOW YELLOW   APPearance CLEAR CLEAR   Specific Gravity, Urine >1.030 (H) 1.005 - 1.030   pH 5.5 5.0 - 8.0   Glucose, UA NEGATIVE NEGATIVE mg/dL   Hgb urine dipstick NEGATIVE NEGATIVE   Bilirubin Urine MODERATE (A) NEGATIVE   Ketones, ur NEGATIVE NEGATIVE mg/dL   Protein, ur 30 (A) NEGATIVE mg/dL   Urobilinogen, UA 0.2 0.0 - 1.0 mg/dL   Nitrite NEGATIVE NEGATIVE   Leukocytes, UA NEGATIVE NEGATIVE  Urine culture     Status: None   Collection  Time: 04/08/2014  2:00 PM  Result Value Ref Range   Specimen Description URINE, CATHETERIZED    Special Requests NONE    Colony Count NO GROWTH Performed at [facility]     Culture NO GROWTH Performed at [facility]     Report Status 04/04/2014 FINAL   Urine microscopic-add on     Status: Abnormal   Collection Time: 04/12/2014  2:00 PM  Result Value Ref Range   Bacteria, UA FEW (A) RARE   Casts HYALINE CASTS (A) NEGATIVE  CBC with Differential     Status: Abnormal   Collection Time: 04/04/2014  2:05 PM  Result Value Ref Range   WBC 10.5 4.0 - 10.5 K/uL   RBC 4.48 3.87 - 5.11 MIL/uL   Hemoglobin 12.7 12.0 - 15.0 g/dL   HCT 38.8 36.0 - 46.0 %   MCV 86.6 78.0 - 100.0 fL   MCH 28.3 26.0 - 34.0 pg   MCHC 32.7 30.0 - 36.0 g/dL   RDW 17.5 (H) 11.5 - 15.5 %   Platelets 250 150 - 400 K/uL   Neutrophils Relative % 82 (H) 43 - 77 %   Lymphocytes Relative 10 (L) 12 - 46 %   Monocytes Relative 8 3 - 12 %   Eosinophils Relative 0 0 - 5 %   Basophils Relative 0 0 - 1 %   Neutro Abs 8.6 (H) 1.7 - 7.7 K/uL   Lymphs Abs 1.1 0.7 - 4.0 K/uL  Monocytes Absolute 0.8 0.1 - 1.0 K/uL   Eosinophils Absolute 0.0 0.0 - 0.7 K/uL   Basophils Absolute 0.0 0.0 - 0.1 K/uL   RBC Morphology SCHISTOCYTES PRESENT (2-5/hpf)     Comment: ACANTHOCYTES   WBC Morphology ATYPICAL LYMPHOCYTES   Comprehensive metabolic panel     Status: Abnormal   Collection Time: 03/28/2014  2:05 PM  Result Value Ref Range   Sodium 142 135 - 145 mmol/L   Potassium 3.2 (L) 3.5 - 5.1 mmol/L   Chloride 111 96 - 112 mmol/L   CO2 10 (LL) 19 - 32 mmol/L    Comment: CRITICAL RESULT CALLED TO, READ BACK BY AND VERIFIED WITH: OSBORNE,T AT 3:00PM ON 04/08/2014 BY FESTERMAN,C    Glucose, Bld 106 (H) 70 - 99 mg/dL   BUN 35 (H) 6 - 23 mg/dL   Creatinine, Ser 2.63 (H) 0.50 - 1.10 mg/dL   Calcium 8.7 8.4 - 10.5 mg/dL   Total Protein 6.4 6.0 - 8.3 g/dL   Albumin 2.9 (L) 3.5 - 5.2 g/dL   AST 47 (H) 0 - 37 U/L   ALT 33 0 - 35  U/L   Alkaline Phosphatase 121 (H) 39 - 117 U/L   Total Bilirubin 1.5 (H) 0.3 - 1.2 mg/dL   GFR calc non Af Amer 15 (L) >90 mL/min   GFR calc Af Amer 18 (L) >90 mL/min    Comment: (NOTE) The eGFR has been calculated using the CKD EPI equation. This calculation has not been validated in all clinical situations. eGFR's persistently <90 mL/min signify possible Chronic Kidney Disease.    Anion gap NOT CALCULATED 5 - 15  Lipase, blood     Status: None   Collection Time: 04/06/2014  2:05 PM  Result Value Ref Range   Lipase 22 11 - 59 U/L  Troponin I     Status: Abnormal   Collection Time: 04/18/2014  2:05 PM  Result Value Ref Range   Troponin I 0.10 (H) <0.031 ng/mL    Comment:        PERSISTENTLY INCREASED TROPONIN VALUES IN THE RANGE OF 0.04-0.49 ng/mL CAN BE SEEN IN:       -UNSTABLE ANGINA       -CONGESTIVE HEART FAILURE       -MYOCARDITIS       -CHEST TRAUMA       -ARRYHTHMIAS       -LATE PRESENTING MYOCARDIAL INFARCTION       -COPD   CLINICAL FOLLOW-UP RECOMMENDED.   Lactic acid, plasma     Status: Abnormal   Collection Time: 04/01/2014  2:05 PM  Result Value Ref Range   Lactic Acid, Venous 6.8 (HH) 0.5 - 2.0 mmol/L    Comment: CRITICAL RESULT CALLED TO, READ BACK BY AND VERIFIED WITH: OSBORNE,T AT 3:00PM ON 04/20/2014 BY FESTERMAN,C   Magnesium     Status: None   Collection Time: 04/12/2014  2:05 PM  Result Value Ref Range   Magnesium 2.2 1.5 - 2.5 mg/dL  Lactic acid, plasma     Status: Abnormal   Collection Time: 04/15/2014  4:42 PM  Result Value Ref Range   Lactic Acid, Venous 3.9 (HH) 0.5 - 2.0 mmol/L    Comment: CRITICAL RESULT CALLED TO, READ BACK BY AND VERIFIED WITH: ANDERSON,D AT 5:20PM ON 04/20/2014 BY FESTERMAN,C   Troponin I     Status: Abnormal   Collection Time: 04/01/2014  4:42 PM  Result Value Ref Range   Troponin I 0.08 (H) <0.031 ng/mL  Comment:        PERSISTENTLY INCREASED TROPONIN VALUES IN THE RANGE OF 0.04-0.49 ng/mL CAN BE SEEN IN:       -UNSTABLE  ANGINA       -CONGESTIVE HEART FAILURE       -MYOCARDITIS       -CHEST TRAUMA       -ARRYHTHMIAS       -LATE PRESENTING MYOCARDIAL INFARCTION       -COPD   CLINICAL FOLLOW-UP RECOMMENDED.   Troponin I     Status: Abnormal   Collection Time: 04/03/14 12:26 AM  Result Value Ref Range   Troponin I 0.12 (H) <0.031 ng/mL    Comment:        PERSISTENTLY INCREASED TROPONIN VALUES IN THE RANGE OF 0.04-0.49 ng/mL CAN BE SEEN IN:       -UNSTABLE ANGINA       -CONGESTIVE HEART FAILURE       -MYOCARDITIS       -CHEST TRAUMA       -ARRYHTHMIAS       -LATE PRESENTING MYOCARDIAL INFARCTION       -COPD   CLINICAL FOLLOW-UP RECOMMENDED.   Troponin I     Status: Abnormal   Collection Time: 04/03/14  8:53 AM  Result Value Ref Range   Troponin I 0.12 (H) <0.031 ng/mL    Comment:        PERSISTENTLY INCREASED TROPONIN VALUES IN THE RANGE OF 0.04-0.49 ng/mL CAN BE SEEN IN:       -UNSTABLE ANGINA       -CONGESTIVE HEART FAILURE       -MYOCARDITIS       -CHEST TRAUMA       -ARRYHTHMIAS       -LATE PRESENTING MYOCARDIAL INFARCTION       -COPD   CLINICAL FOLLOW-UP RECOMMENDED.   Comprehensive metabolic panel     Status: Abnormal   Collection Time: 04/03/14  8:53 AM  Result Value Ref Range   Sodium 143 135 - 145 mmol/L   Potassium 3.5 3.5 - 5.1 mmol/L   Chloride 119 (H) 96 - 112 mmol/L   CO2 15 (L) 19 - 32 mmol/L   Glucose, Bld 101 (H) 70 - 99 mg/dL   BUN 41 (H) 6 - 23 mg/dL   Creatinine, Ser 2.33 (H) 0.50 - 1.10 mg/dL   Calcium 7.6 (L) 8.4 - 10.5 mg/dL   Total Protein 5.9 (L) 6.0 - 8.3 g/dL   Albumin 2.8 (L) 3.5 - 5.2 g/dL   AST 76 (H) 0 - 37 U/L   ALT 44 (H) 0 - 35 U/L   Alkaline Phosphatase 105 39 - 117 U/L   Total Bilirubin 1.1 0.3 - 1.2 mg/dL   GFR calc non Af Amer 18 (L) >90 mL/min   GFR calc Af Amer 20 (L) >90 mL/min    Comment: (NOTE) The eGFR has been calculated using the CKD EPI equation. This calculation has not been validated in all clinical situations. eGFR's  persistently <90 mL/min signify possible Chronic Kidney Disease.    Anion gap 9 5 - 15  CBC     Status: Abnormal   Collection Time: 04/03/14  8:53 AM  Result Value Ref Range   WBC 17.7 (H) 4.0 - 10.5 K/uL   RBC 4.33 3.87 - 5.11 MIL/uL   Hemoglobin 12.1 12.0 - 15.0 g/dL   HCT 36.8 36.0 - 46.0 %   MCV 85.0 78.0 - 100.0 fL   MCH 27.9 26.0 -  34.0 pg   MCHC 32.9 30.0 - 36.0 g/dL   RDW 17.7 (H) 11.5 - 15.5 %   Platelets 208 150 - 400 K/uL  Protime-INR     Status: Abnormal   Collection Time: 04/03/14  8:53 AM  Result Value Ref Range   Prothrombin Time 17.3 (H) 11.6 - 15.2 seconds   INR 1.40 0.00 - 7.37  Basic metabolic panel     Status: Abnormal   Collection Time: 04/04/14  6:42 AM  Result Value Ref Range   Sodium 144 135 - 145 mmol/L   Potassium 3.9 3.5 - 5.1 mmol/L   Chloride 111 96 - 112 mmol/L   CO2 21 19 - 32 mmol/L   Glucose, Bld 147 (H) 70 - 99 mg/dL   BUN 51 (H) 6 - 23 mg/dL   Creatinine, Ser 3.07 (H) 0.50 - 1.10 mg/dL   Calcium 7.6 (L) 8.4 - 10.5 mg/dL   GFR calc non Af Amer 13 (L) >90 mL/min   GFR calc Af Amer 15 (L) >90 mL/min    Comment: (NOTE) The eGFR has been calculated using the CKD EPI equation. This calculation has not been validated in all clinical situations. eGFR's persistently <90 mL/min signify possible Chronic Kidney Disease.    Anion gap 12 5 - 15    ABGS No results for input(s): PHART, PO2ART, TCO2, HCO3 in the last 72 hours.  Invalid input(s): PCO2 CULTURES Recent Results (from the past 240 hour(s))  Urine culture     Status: None   Collection Time: 03/31/2014  2:00 PM  Result Value Ref Range Status   Specimen Description URINE, CATHETERIZED  Final   Special Requests NONE  Final   Colony Count NO GROWTH Performed at [facility]   Final   Culture NO GROWTH Performed at [facility]   Final   Report Status 04/04/2014 FINAL  Final   Studies/Results: Ct Abdomen Pelvis Wo Contrast  04/18/2014   CLINICAL DATA:  Abdominal  pain, hypotension  EXAM: CT ABDOMEN AND PELVIS WITHOUT CONTRAST  TECHNIQUE: Multidetector CT imaging of the abdomen and pelvis was performed following the standard protocol without IV contrast.  COMPARISON:  None.  FINDINGS: Lower chest:  Lung bases are clear.  Small hiatal hernia.  Hepatobiliary: The liver is replaced by poorly visualized round and oval masses, not well individually measure due to lack of contrast. Largest of these located within the anterior segment right hepatic lobe measures 4.9 x 4.2 cm image 24. Gallbladder is unremarkable. No intrahepatic ductal dilatation.  Pancreas: Atrophic, fatty replaced, poorly visualized in degraded by respiratory motion but no focal abnormality identified.  Spleen: Normal  Adrenals/Urinary Tract: Adrenal glands appear normal. Right upper renal pole 3.3 cm cyst noted. Right lower renal pole 1.4 cm cyst. Left kidney are well visualized due to motion artifact but no gross evidence for focal abnormality. No hydronephrosis on either side. No radiopaque renal or ureteral calculus.  Stomach/Bowel: No bowel wall thickening or focal segmental dilatation is identified. The appendix appears normal, image 53. Stomach is unremarkable. The rectum/anal verge is not well imaged and the remainder of the bowel is poorly visualized without contrast.  Vascular/Lymphatic: Retroperitoneal lymphadenopathy is identified with representative aortocaval node measuring 1.9 cm image 38. Gastrohepatic node measures 1.3 cm image 27. No pelvic sidewall or inguinal lymphadenopathy.  Other: Uterus and ovaries appear normal. No ascites or free air. No measurable omental mass. Dense atheromatous aortic calcification without aneurysm.  Musculoskeletal: Degenerative changes are noted in the  spine. Left femoral hardware partly visualized. No compression fracture identified.  IMPRESSION: Liver replaced by innumerable oval and round masses with concomitant upper abdominal gastrohepatic and retroperitoneal  lymphadenopathy. Metastatic disease from unknown primary or multi focal hepatocellular carcinoma could have this appearance. Tissue sampling is recommended for further characterization.  These results were called by telephone at the time of interpretation on 03/31/2014 at 4:24 pm to Dr. Francine Graven , who verbally acknowledged these results.   Electronically Signed   By: Conchita Paris M.D.   On: 04/23/2014 16:25   Dg Chest Port 1 View  04/15/2014   CLINICAL DATA:  Hypotension, abdominal pain  EXAM: PORTABLE CHEST - 1 VIEW  COMPARISON:  None.  FINDINGS: Hyperinflation suggests emphysema. Mild cardiomegaly noted. No evidence for edema. No focal pulmonary opacity. No pleural effusion. No acute osseous finding.  IMPRESSION: Cardiomegaly with hyperinflation suggesting emphysema but no focal acute finding.   Electronically Signed   By: Conchita Paris M.D.   On: 04/17/2014 15:29   Dg Abd Portable 2v  04/04/2014   CLINICAL DATA:  Abdominal pain and hypotension.  EXAM: PORTABLE ABDOMEN - 2 VIEW  COMPARISON:  03/31/2013 and 11/27/2010  FINDINGS: Bowel gas pattern is nonobstructive. There is no free peritoneal air. There are moderate degenerative changes of the spine a mild degenerative change of the hips. Lung bases are unremarkable.  IMPRESSION: Nonobstructive bowel gas pattern.   Electronically Signed   By: Marin Olp M.D.   On: 04/22/2014 15:31    Medications: I have reviewed the patient's current medications.  Assesment:   Active Problems:   Hypokalemia   Diarrhea   Dehydration   Metastatic cancer to liver    Plan: Medications reviewed Will adjust IV fluid Will D/C GI consult Social worker for Nursing home placement Will monitor BMp    LOS: 2 days   Nai Borromeo 04/04/2014, 7:57 AM

## 2014-04-05 DIAGNOSIS — E43 Unspecified severe protein-calorie malnutrition: Secondary | ICD-10-CM | POA: Insufficient documentation

## 2014-04-06 NOTE — Clinical Documentation Improvement (Deleted)
The impression of the BUN/CR/GR is abnormal (see below). Pt presented with dehydration, diarrhea, and hypokalemia.   Can the underlying etiology of the abnormal BUN/CR/GFR be further specified?   Possible Clinical Conditions?                                   Other Condition___________________                 Cannot Clinically Determine_________   Supporting Information: Risk Factors:  Liver cancer, Severe malnutrition,  Diagnostics: Component      BUN Creatinine  Latest Ref Rng      6 - 23 mg/dL 0.50 - 1.10 mg/dL  04/16/2014     2:05 PM 35 (H) 2.63 (H)  04/03/2014     8:53 AM 41 (H) 2.33 (H)  04/04/2014      51 (H) 3.07 (H)   Component      GFR calc Af Amer  Latest Ref Rng      >90 mL/min  04/04/2014     2:05 PM 18 (L)  04/03/2014     8:53 AM 20 (L)  04/04/2014      15 (L)    Treatment:  sodium chloride 0.9 % bolus 500 mL x 3 0.9 %  sodium chloride infusion    0.9 % NaCl with KCl 20 mEq/ L  infusion   Thank You, Heloise Beecham ,RN Clinical Documentation Specialist:  [place] [facility]

## 2014-04-06 NOTE — Clinical Documentation Improvement (Deleted)
The impression of the BUN/CR/GR is abnormal (see below). Pt presented with dehydration, diarrhea, and hypokalemia.   Can the underlying etiology of the abnormal BUN/CR/GFR be further specified?   Possible Clinical Conditions?                                   Other Condition___________________                 Cannot Clinically Determine_________   Supporting Information: Risk Factors:  Liver cancer, Severe malnutrition,  Diagnostics: Component      BUN Creatinine  Latest Ref Rng      6 - 23 mg/dL 0.50 - 1.10 mg/dL  04/08/2014     2:05 PM 35 (H) 2.63 (H)  04/03/2014     8:53 AM 41 (H) 2.33 (H)  04/04/2014      51 (H) 3.07 (H)   Component      GFR calc Af Amer  Latest Ref Rng      >90 mL/min  03/28/2014     2:05 PM 18 (L)  04/03/2014     8:53 AM 20 (L)  04/04/2014      15 (L)    Treatment:   Thank You, Heloise Beecham ,RN Clinical Documentation Specialist:  [place] Information [facility]

## 2014-04-25 NOTE — Care Management Note (Addendum)
    Page 1 of 1   Apr 26, 2014     5:02:59 PM CARE MANAGEMENT NOTE 2014/04/26  Patient:  Leslie Pope, Leslie Pope   Account Number:  [id number]  Date Initiated:  04/04/2014  Documentation initiated by:  Vladimir Creeks  Subjective/Objective Assessment:   Admitted with diarrhea and wt loss, dehydration. Found to have metastatic liver disease. Pt is from home, but she and family have decided she will go to [facility] [place] [facility]     Action/Plan:   CSW has made referral to [facility] home and they are to visit pt and family today   Anticipated DC Date:  April 26, 2014   Anticipated DC Plan:  [place] referral  Clinical Social Worker      DC Planning Services  CM consult      Choice offered to / List presented to:             Status of service:  Completed, signed off Medicare Important Message given?  NA - LOS <3 / Initial given by admissions (If response is "NO", the following Medicare IM given date fields will be blank) Date Medicare IM given:   Medicare IM given by:   Date Additional Medicare IM given:   Additional Medicare IM given by:    Discharge Disposition:  EXPIRED  Per UR Regulation:  Reviewed for med. necessity/level of care/duration of stay  If discussed at [place] of Stay Meetings, dates discussed:    Comments:  04/04/14/ [place] RN/CM Pt accepted by [facility], but no beds available, so pt will become a GIP at [facility]. CSW notified Dr Legrand Rams of this, and he will do discharge and readmit to [facility] bed 04/04/14 1430 Vladimir Creeks RN/CM

## 2014-04-25 NOTE — Progress Notes (Signed)
55- family came out to nurse station for nurse to check on pt, they believed she had expired.  Lovena Le RN called patients nurse to go to room.  This nurse along with Lovena Le RN and Dr Legrand Rams in to room to assess pt.    0820- No respirations or pulse were found.  Family at bedside.  Chaplain called to provide support for family.  Family support provided.  Family members allowed time to spend with patient and other family members.  Will continue to provide support to family at this time

## 2014-04-25 NOTE — H&P (Signed)
Leslie Pope MRN: [id number] DOB/AGE: [birth year] 81 y.o. Primary Care Physician:Simren Popson, MD Admit date: 04/16/2014 Chief Complaint:  Unable to provide hisotry HPI:  This is an 81 years old female with history of multiple medical illnesses who recently admitted to [facility] was found to have metastatic liver disease. Patient had diarrhea and significant weight loss. Her renal function also deteriorated due volume loss. Patient declined fast and was unable to take po feeding. Her condition and her prognosis was discussed with family and they requested comfort car and hospice service. Patient is being transferred in hospital hospice service.    Past Medical History  Diagnosis Date  . Hypertension     Lab: Normal BMet in 11/2010  . Chronic knee pain     status post bilateral TKA  . Deep vein thrombosis 11/2010    following left femoral fracture with closed reduction and immobilization in a knee brace  . Anemia 11/2010    post fracture; transfused 2 units; H&H of 10.6/32.5 on 12/05/10  . Arthritis   . Malnutrition    Past Surgical History  Procedure Laterality Date  . Total knee arthroplasty Bilateral   . Orif distal femur fracture Left 11/2010        Family History  Problem Relation Age of Onset  . Hypertension    . Diabetes    . Arthritis      Social History:  reports that she has never smoked. She has never used smokeless tobacco. She reports that she does not drink alcohol or use illicit drugs.   Allergies: No Known Allergies  Medications Prior to Admission  Medication Sig Dispense Refill  . AZOR 10-40 MG per tablet Take 1 tablet by mouth daily.  3  . ciprofloxacin (CIPRO) 500 MG tablet Take 1 tablet (500 mg total) by mouth 2 (two) times daily. 10 tablet 0  . diphenoxylate-atropine (LOMOTIL) 2.5-0.025 MG per tablet Take 1 tablet by mouth every 6 (six) hours as needed for diarrhea or loose stools.   0  . losartan (COZAAR) 50 MG tablet Take 1 tablet by mouth  daily.  3       PJA:SNKNL from the symptoms mentioned above,there are no other symptoms referable to all systems reviewed.  Physical Exam: Blood pressure 55/20, pulse 27, temperature 97.3 F (36.3 C), temperature source Axillary, resp. rate 39, height 5\' 5"  (1.651 m), weight 53.1 kg (117 lb 1 oz), SpO2 70 %. General Condition: Chronically sick looking, cachetic, unresponsive to verbal communication HE ENT- pupils reactive bilaterally, neck supple Respiratory- fast shallow respiration, bilateral scattered rhonchi CVS- S1 and S2  Heard, tachycardiac Abdomen- softr and lax, bowel sound is positive, tenderness in the RUQ and umblical area EXT- no leg edema, Neuro- lethargic, responds to painful stimuli    Recent Labs  04/14/2014 1405 04/03/14 0853  WBC 10.5 17.7*  NEUTROABS 8.6*  --   HGB 12.7 12.1  HCT 38.8 36.8  MCV 86.6 85.0  PLT 250 208    Recent Labs  04/04/2014 1405 04/03/14 0853 04/04/14 0642  NA 142 143 144  K 3.2* 3.5 3.9  CL 111 119* 111  CO2 10* 15* 21  GLUCOSE 106* 101* 147*  BUN 35* 41* 51*  CREATININE 2.63* 2.33* 3.07*  CALCIUM 8.7 7.6* 7.6*  MG 2.2  --   --   lablast2(ast:2,ALT:2,alkphos:2,bilitot:2,prot:2,albumin:2)@    Recent Results (from the past 240 hour(s))  Urine culture     Status: None   Collection Time: 04/03/2014  2:00 PM  Result Value Ref Range Status   Specimen Description URINE, CATHETERIZED  Final   Special Requests NONE  Final   Colony Count NO GROWTH Performed at [facility]   Final   Culture NO GROWTH Performed at [facility]   Final   Report Status 04/04/2014 FINAL  Final     Ct Abdomen Pelvis Wo Contrast  04/16/2014   CLINICAL DATA:  Abdominal pain, hypotension  EXAM: CT ABDOMEN AND PELVIS WITHOUT CONTRAST  TECHNIQUE: Multidetector CT imaging of the abdomen and pelvis was performed following the standard protocol without IV contrast.  COMPARISON:  None.  FINDINGS: Lower chest:  Lung bases are clear.  Small  hiatal hernia.  Hepatobiliary: The liver is replaced by poorly visualized round and oval masses, not well individually measure due to lack of contrast. Largest of these located within the anterior segment right hepatic lobe measures 4.9 x 4.2 cm image 24. Gallbladder is unremarkable. No intrahepatic ductal dilatation.  Pancreas: Atrophic, fatty replaced, poorly visualized in degraded by respiratory motion but no focal abnormality identified.  Spleen: Normal  Adrenals/Urinary Tract: Adrenal glands appear normal. Right upper renal pole 3.3 cm cyst noted. Right lower renal pole 1.4 cm cyst. Left kidney are well visualized due to motion artifact but no gross evidence for focal abnormality. No hydronephrosis on either side. No radiopaque renal or ureteral calculus.  Stomach/Bowel: No bowel wall thickening or focal segmental dilatation is identified. The appendix appears normal, image 53. Stomach is unremarkable. The rectum/anal verge is not well imaged and the remainder of the bowel is poorly visualized without contrast.  Vascular/Lymphatic: Retroperitoneal lymphadenopathy is identified with representative aortocaval node measuring 1.9 cm image 38. Gastrohepatic node measures 1.3 cm image 27. No pelvic sidewall or inguinal lymphadenopathy.  Other: Uterus and ovaries appear normal. No ascites or free air. No measurable omental mass. Dense atheromatous aortic calcification without aneurysm.  Musculoskeletal: Degenerative changes are noted in the spine. Left femoral hardware partly visualized. No compression fracture identified.  IMPRESSION: Liver replaced by innumerable oval and round masses with concomitant upper abdominal gastrohepatic and retroperitoneal lymphadenopathy. Metastatic disease from unknown primary or multi focal hepatocellular carcinoma could have this appearance. Tissue sampling is recommended for further characterization.  These results were called by telephone at the time of interpretation on 04/07/2014 at  4:24 pm to Dr. Francine Graven , who verbally acknowledged these results.   Electronically Signed   By: Conchita Paris M.D.   On: 04/16/2014 16:25   Dg Chest Port 1 View  04/01/2014   CLINICAL DATA:  Hypotension, abdominal pain  EXAM: PORTABLE CHEST - 1 VIEW  COMPARISON:  None.  FINDINGS: Hyperinflation suggests emphysema. Mild cardiomegaly noted. No evidence for edema. No focal pulmonary opacity. No pleural effusion. No acute osseous finding.  IMPRESSION: Cardiomegaly with hyperinflation suggesting emphysema but no focal acute finding.   Electronically Signed   By: Conchita Paris M.D.   On: 04/22/2014 15:29   Dg Abd Portable 2v  03/28/2014   CLINICAL DATA:  Abdominal pain and hypotension.  EXAM: PORTABLE ABDOMEN - 2 VIEW  COMPARISON:  03/31/2013 and 11/27/2010  FINDINGS: Bowel gas pattern is nonobstructive. There is no free peritoneal air. There are moderate degenerative changes of the spine a mild degenerative change of the hips. Lung bases are unremarkable.  IMPRESSION: Nonobstructive bowel gas pattern.   Electronically Signed   By: Marin Olp M.D.   On: 04/08/2014 15:31   Impression:  Active Problems:   Hypokalemia   Diarrhea  Dehydration   Metastatic cancer to liver   Protein-calorie malnutrition, severe     Plan: Comfort care Hospice consult      Leyda Vanderwerf   2014/04/15, 8:09 AM

## 2014-04-25 NOTE — Discharge Summary (Signed)
Physician Discharge Summary  Patient ID: Leslie Pope MRN: [id number] DOB/AGE: 1926/02/16 81 y.o. Primary Care Physician:Hardie Veltre, MD Admit date: 04/20/2014 Discharge date: 2014/04/11    Discharge Diagnoses:   Active Problems:   Hypokalemia   Diarrhea   Dehydration   Metastatic cancer to liver   Protein-calorie malnutrition, severe     Medication List    ASK your doctor about these medications        AZOR 10-40 MG per tablet  Generic drug:  amLODipine-olmesartan  Take 1 tablet by mouth daily.     ciprofloxacin 500 MG tablet  Commonly known as:  CIPRO  Take 1 tablet (500 mg total) by mouth 2 (two) times daily.     diphenoxylate-atropine 2.5-0.025 MG per tablet  Commonly known as:  LOMOTIL  Take 1 tablet by mouth every 6 (six) hours as needed for diarrhea or loose stools.     losartan 50 MG tablet  Commonly known as:  COZAAR  Take 1 tablet by mouth daily.        Discharged Condition: Declined    Consults: hospice  Significant Diagnostic Studies: Ct Abdomen Pelvis Wo Contrast  04/19/2014   CLINICAL DATA:  Abdominal pain, hypotension  EXAM: CT ABDOMEN AND PELVIS WITHOUT CONTRAST  TECHNIQUE: Multidetector CT imaging of the abdomen and pelvis was performed following the standard protocol without IV contrast.  COMPARISON:  None.  FINDINGS: Lower chest:  Lung bases are clear.  Small hiatal hernia.  Hepatobiliary: The liver is replaced by poorly visualized round and oval masses, not well individually measure due to lack of contrast. Largest of these located within the anterior segment right hepatic lobe measures 4.9 x 4.2 cm image 24. Gallbladder is unremarkable. No intrahepatic ductal dilatation.  Pancreas: Atrophic, fatty replaced, poorly visualized in degraded by respiratory motion but no focal abnormality identified.  Spleen: Normal  Adrenals/Urinary Tract: Adrenal glands appear normal. Right upper renal pole 3.3 cm cyst noted. Right lower renal pole 1.4 cm cyst. Left  kidney are well visualized due to motion artifact but no gross evidence for focal abnormality. No hydronephrosis on either side. No radiopaque renal or ureteral calculus.  Stomach/Bowel: No bowel wall thickening or focal segmental dilatation is identified. The appendix appears normal, image 53. Stomach is unremarkable. The rectum/anal verge is not well imaged and the remainder of the bowel is poorly visualized without contrast.  Vascular/Lymphatic: Retroperitoneal lymphadenopathy is identified with representative aortocaval node measuring 1.9 cm image 38. Gastrohepatic node measures 1.3 cm image 27. No pelvic sidewall or inguinal lymphadenopathy.  Other: Uterus and ovaries appear normal. No ascites or free air. No measurable omental mass. Dense atheromatous aortic calcification without aneurysm.  Musculoskeletal: Degenerative changes are noted in the spine. Left femoral hardware partly visualized. No compression fracture identified.  IMPRESSION: Liver replaced by innumerable oval and round masses with concomitant upper abdominal gastrohepatic and retroperitoneal lymphadenopathy. Metastatic disease from unknown primary or multi focal hepatocellular carcinoma could have this appearance. Tissue sampling is recommended for further characterization.  These results were called by telephone at the time of interpretation on 04/08/2014 at 4:24 pm to Dr. Francine Graven , who verbally acknowledged these results.   Electronically Signed   By: Conchita Paris M.D.   On: 04/10/2014 16:25   Dg Chest Port 1 View  03/29/2014   CLINICAL DATA:  Hypotension, abdominal pain  EXAM: PORTABLE CHEST - 1 VIEW  COMPARISON:  None.  FINDINGS: Hyperinflation suggests emphysema. Mild cardiomegaly noted. No evidence for edema. No focal pulmonary  opacity. No pleural effusion. No acute osseous finding.  IMPRESSION: Cardiomegaly with hyperinflation suggesting emphysema but no focal acute finding.   Electronically Signed   By: Conchita Paris M.D.    On: 04/01/2014 15:29   Dg Abd Portable 2v  03/31/2014   CLINICAL DATA:  Abdominal pain and hypotension.  EXAM: PORTABLE ABDOMEN - 2 VIEW  COMPARISON:  03/31/2013 and 11/27/2010  FINDINGS: Bowel gas pattern is nonobstructive. There is no free peritoneal air. There are moderate degenerative changes of the spine a mild degenerative change of the hips. Lung bases are unremarkable.  IMPRESSION: Nonobstructive bowel gas pattern.   Electronically Signed   By: Marin Olp M.D.   On: 04/11/2014 15:31    Lab Results: Basic Metabolic Panel:  Recent Labs  04/17/2014 1405 04/03/14 0853 04/04/14 0642  NA 142 143 144  K 3.2* 3.5 3.9  CL 111 119* 111  CO2 10* 15* 21  GLUCOSE 106* 101* 147*  BUN 35* 41* 51*  CREATININE 2.63* 2.33* 3.07*  CALCIUM 8.7 7.6* 7.6*  MG 2.2  --   --    Liver Function Tests:  Recent Labs  04/18/2014 1405 04/03/14 0853  AST 47* 76*  ALT 33 44*  ALKPHOS 121* 105  BILITOT 1.5* 1.1  PROT 6.4 5.9*  ALBUMIN 2.9* 2.8*     CBC:  Recent Labs  04/03/2014 1405 04/03/14 0853  WBC 10.5 17.7*  NEUTROABS 8.6*  --   HGB 12.7 12.1  HCT 38.8 36.8  MCV 86.6 85.0  PLT 250 208    Recent Results (from the past 240 hour(s))  Urine culture     Status: None   Collection Time: 04/04/2014  2:00 PM  Result Value Ref Range Status   Specimen Description URINE, CATHETERIZED  Final   Special Requests NONE  Final   Colony Count NO GROWTH Performed at [facility]   Final   Culture NO GROWTH Performed at [facility]   Final   Report Status 04/04/2014 FINAL  Final     Hospital Course:   This is an 81 years old female with history of multiple medical illnesses  was due to diarrhea and abdominal pain. Her CT Scan of the abdomen showed metastatic liver diseases. Patient has lost significant amount of weight and severely malnourished. After discussing with her family about patient condition and her prognosis they request to do only comfort care and transfer patient to  hospice service.  Discharge Exam: Blood pressure 55/20, pulse 27, temperature 97.3 F (36.3 C), temperature source Axillary, resp. rate 39, height 5\' 5"  (1.651 m), weight 53.1 kg (117 lb 1 oz), SpO2 70 %.   Disposition:  Hospice service.      Signed: Zayonna Ayuso   05-02-2014, 8:00 AM

## 2014-04-25 NOTE — Progress Notes (Signed)
Present with patient's family for support.

## 2014-04-25 DEATH — deceased

## 2015-04-13 IMAGING — CT CT ABD-PELV W/O CM
2 of 4 series · 15 of 46 positions shown, 17 images · non-contrast
Comparison: None.

CLINICAL DATA: Abdominal pain, hypotension

EXAM:
CT ABDOMEN AND PELVIS WITHOUT CONTRAST
TECHNIQUE: Multidetector CT imaging of the abdomen and pelvis was performed
following the standard protocol without IV contrast.

[Series 2: abdomen/pelvis w/o contrast · axial · non-contrast · 0.62mm/px · z∈[-430,-70]mm · 12 of 80 slices shown, 14 images]
[im 4/80  soft-tissue]
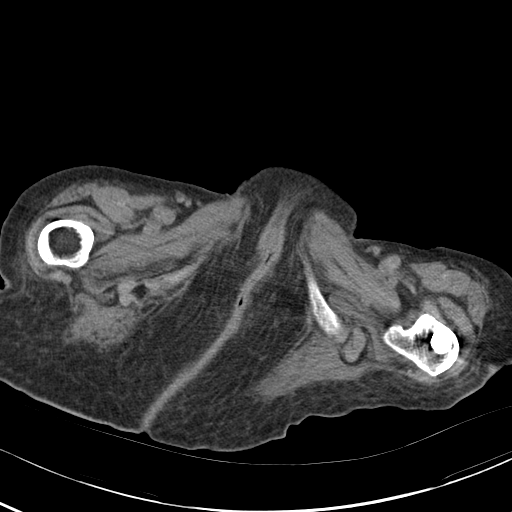
[im 4/80  bone]
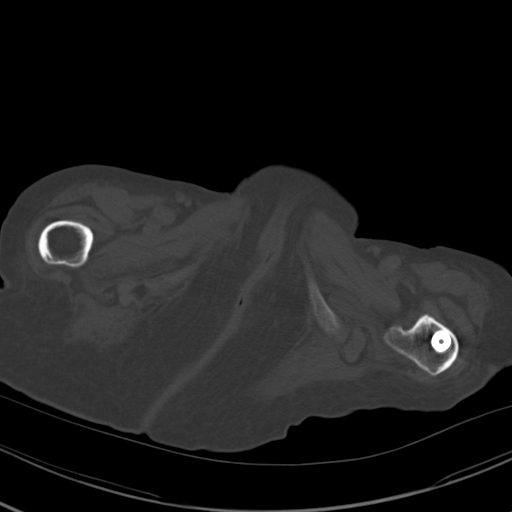
[im 10/80  soft-tissue]
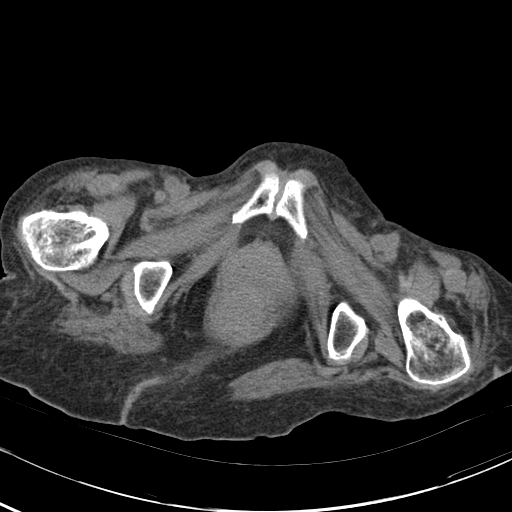
[im 17/80  soft-tissue]
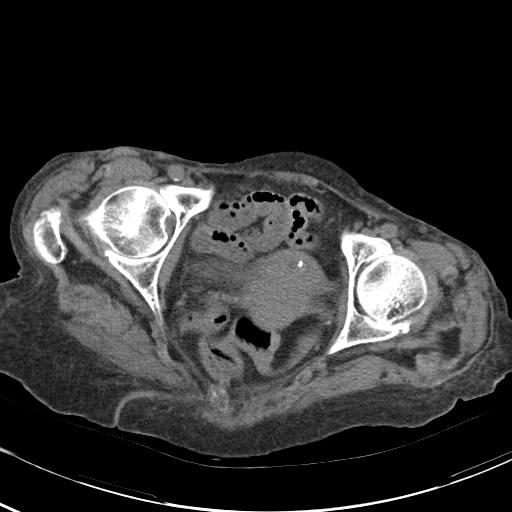
[im 24/80  soft-tissue]
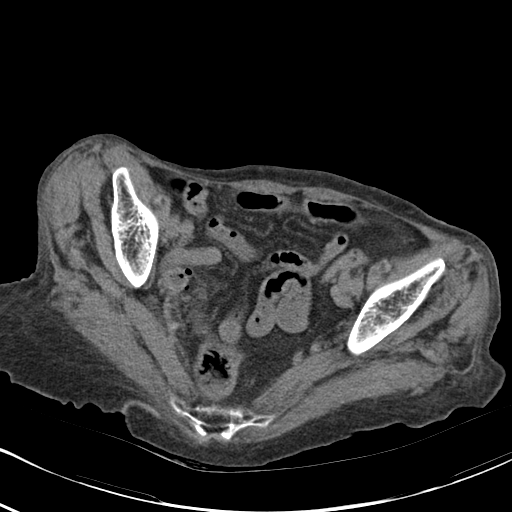
[im 30/80  soft-tissue]
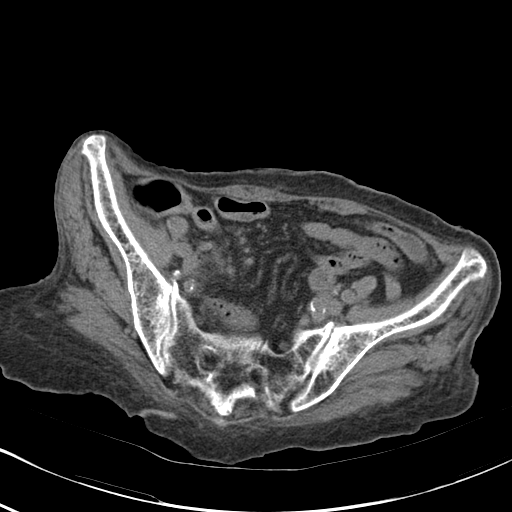
[im 37/80  soft-tissue]
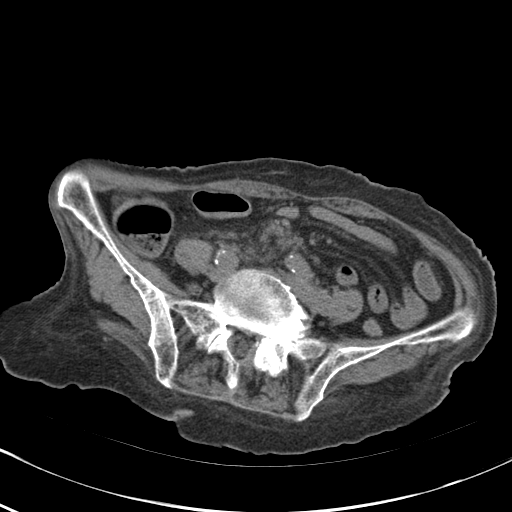
[im 43/80  soft-tissue]
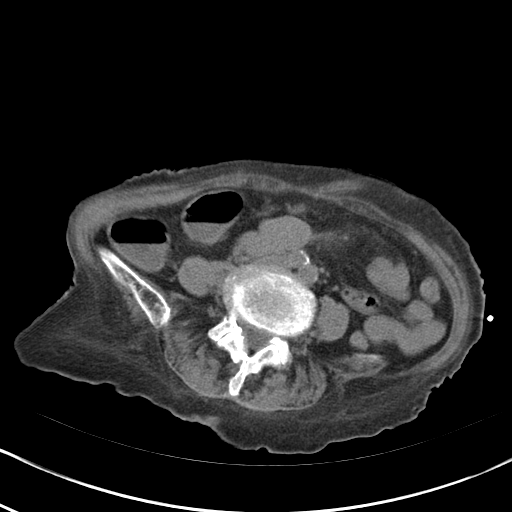
[im 50/80  soft-tissue]
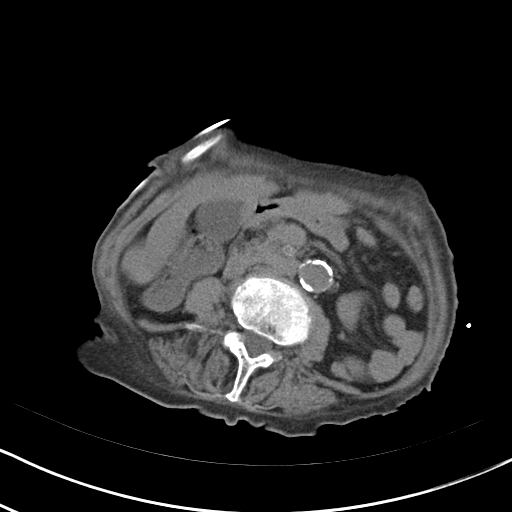
[im 56/80  soft-tissue]
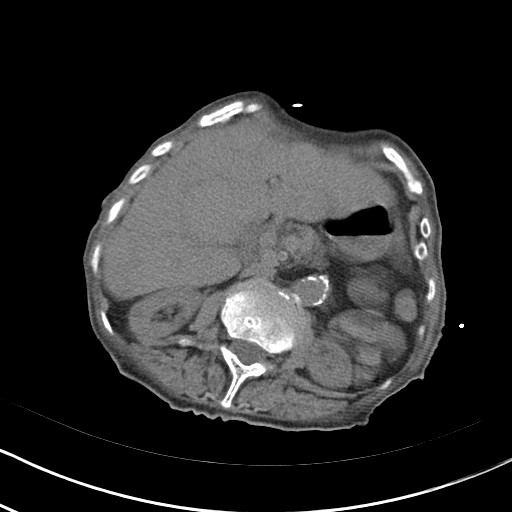
[im 56/80  bone]
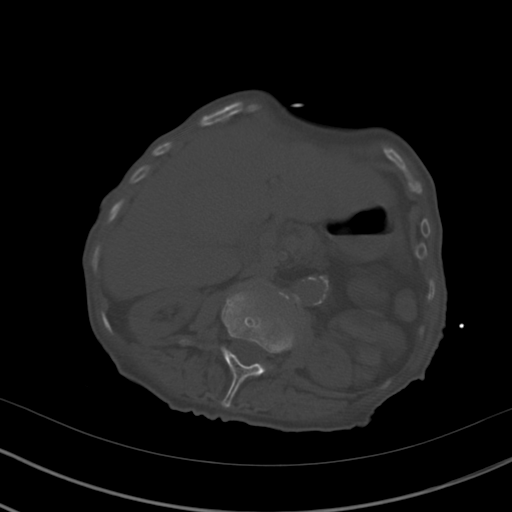
[im 63/80  soft-tissue]
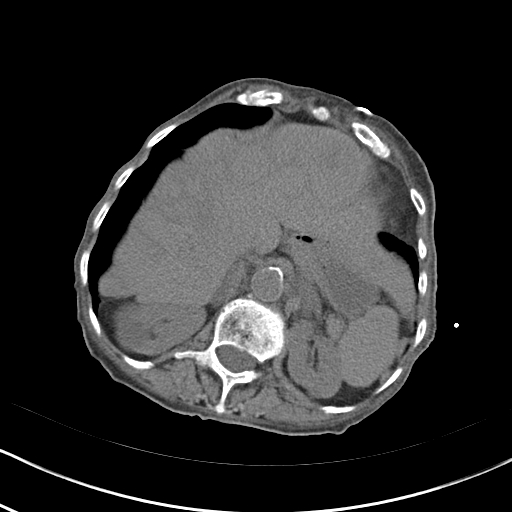
[im 70/80  soft-tissue]
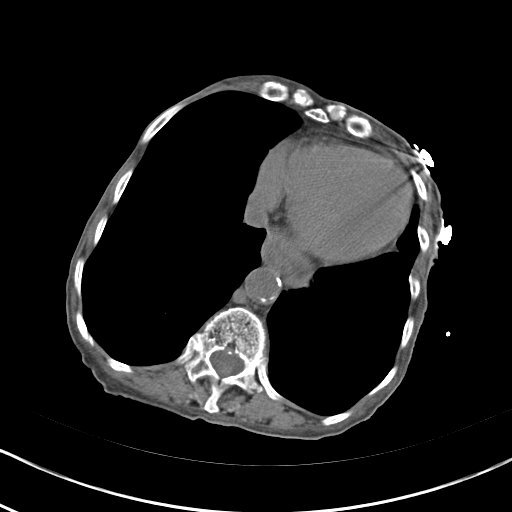
[im 76/80  soft-tissue]
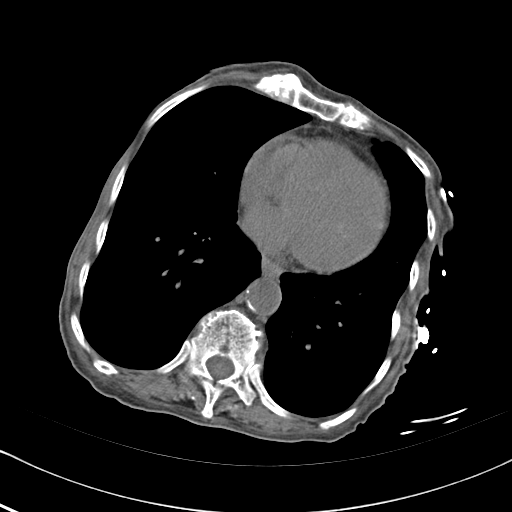

[Series 3: mpr cor (id) · coronal · 0.65mm/px · 3 of 85 slices shown]
[im 29/85  soft-tissue]
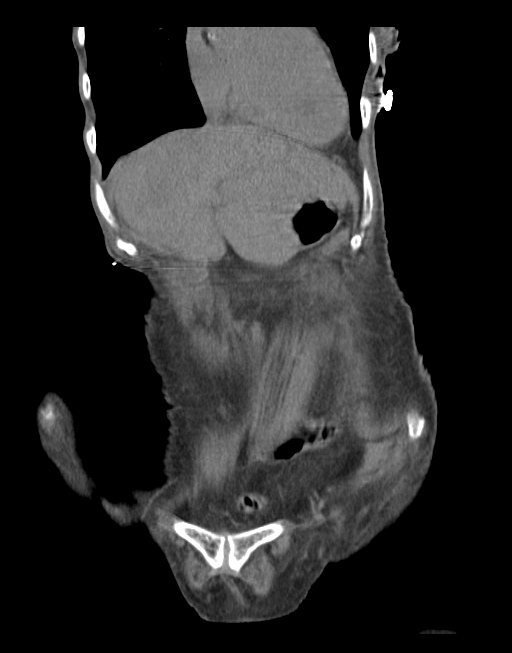
[im 38/85  soft-tissue]
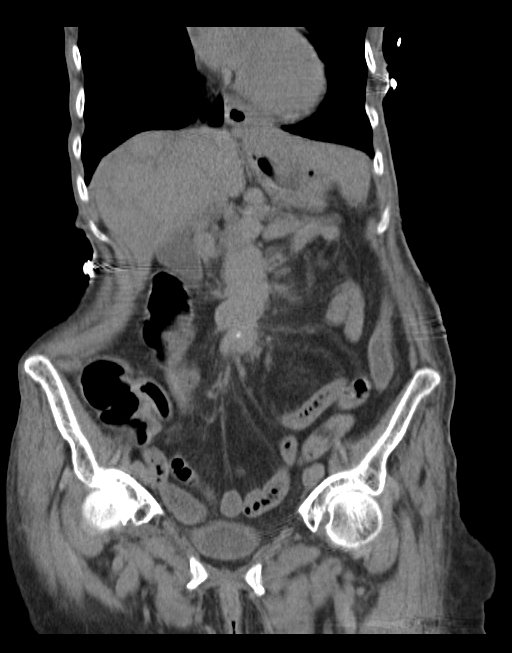
[im 47/85  soft-tissue]
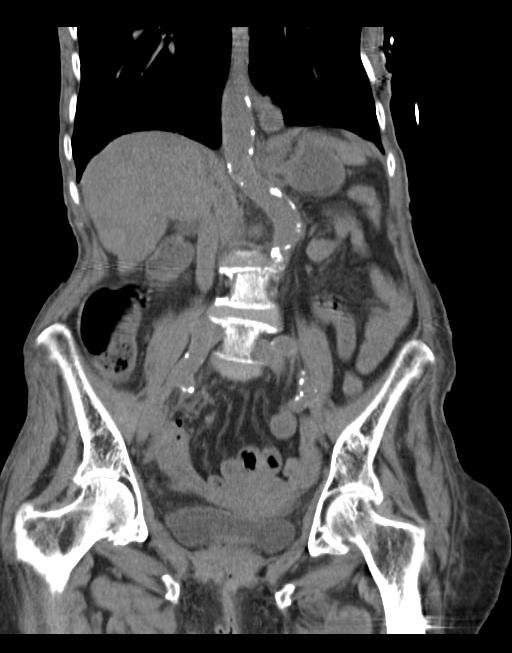

[15 of 46 positions shown; findings below may reference images not displayed]

FINDINGS: Lower chest:  Lung bases are clear.  Small hiatal hernia.

Hepatobiliary: The liver is replaced by poorly visualized round and
oval masses, not well individually measure due to lack of contrast.
Largest of these located within the anterior segment right hepatic
lobe measures 4.9 x 4.2 cm image 24. Gallbladder is unremarkable. No
intrahepatic ductal dilatation.

Pancreas: Atrophic, fatty replaced, poorly visualized in degraded by
respiratory motion but no focal abnormality identified.

Spleen: Normal

Adrenals/Urinary Tract: Adrenal glands appear normal. Right upper
renal pole 3.3 cm cyst noted. Right lower renal pole 1.4 cm cyst.
Left kidney are well visualized due to motion artifact but no gross
evidence for focal abnormality. No hydronephrosis on either side. No
radiopaque renal or ureteral calculus.

Stomach/Bowel: No bowel wall thickening or focal segmental
dilatation is identified. The appendix appears normal, image 53.
Stomach is unremarkable. The rectum/anal verge is not well imaged
and the remainder of the bowel is poorly visualized without
contrast.

Vascular/Lymphatic: Retroperitoneal lymphadenopathy is identified
with representative aortocaval node measuring 1.9 cm image 38.
Gastrohepatic node measures 1.3 cm image 27. No pelvic sidewall or
inguinal lymphadenopathy.

Other: Uterus and ovaries appear normal. No ascites or free air. No
measurable omental mass. Dense atheromatous aortic calcification
without aneurysm.

Musculoskeletal: Degenerative changes are noted in the spine. Left
femoral hardware partly visualized. No compression fracture
identified.
IMPRESSION: Liver replaced by innumerable oval and round masses with concomitant
upper abdominal gastrohepatic and retroperitoneal lymphadenopathy.
Metastatic disease from unknown primary or multi focal
hepatocellular carcinoma could have this appearance. Tissue sampling
is recommended for further characterization.

These results were called by telephone at the time of interpretation
on 04/02/2014 at [DATE] to Dr. ADESUYI LOBA , who verbally
acknowledged these results.
